# Patient Record
Sex: Male | Born: 1985 | Race: Black or African American | Hispanic: No | Marital: Single | Smoking: Current some day smoker
Health system: Southern US, Community
[De-identification: ages and names within clinical notes are randomized; demographics above are authoritative.]

## PROBLEM LIST (undated history)

## (undated) DIAGNOSIS — J45909 Unspecified asthma, uncomplicated: Secondary | ICD-10-CM

## (undated) HISTORY — PX: NO PAST SURGERIES: SHX2092

## (undated) HISTORY — PX: SHOULDER SURGERY: SHX246

## (undated) HISTORY — PX: WRIST SURGERY: SHX841

---

## 2011-07-04 ENCOUNTER — Emergency Department (INDEPENDENT_AMBULATORY_CARE_PROVIDER_SITE_OTHER): Payer: Self-pay

## 2011-07-04 ENCOUNTER — Encounter (HOSPITAL_BASED_OUTPATIENT_CLINIC_OR_DEPARTMENT_OTHER): Payer: Self-pay | Admitting: Emergency Medicine

## 2011-07-04 ENCOUNTER — Emergency Department (HOSPITAL_BASED_OUTPATIENT_CLINIC_OR_DEPARTMENT_OTHER)
Admission: EM | Admit: 2011-07-04 | Discharge: 2011-07-04 | Disposition: A | Discharge status: Home / Self Care | Payer: Self-pay | Attending: Emergency Medicine | Admitting: Emergency Medicine

## 2011-07-04 DIAGNOSIS — R350 Frequency of micturition: Secondary | ICD-10-CM | POA: Insufficient documentation

## 2011-07-04 DIAGNOSIS — R079 Chest pain, unspecified: Secondary | ICD-10-CM

## 2011-07-04 DIAGNOSIS — M549 Dorsalgia, unspecified: Secondary | ICD-10-CM | POA: Insufficient documentation

## 2011-07-04 DIAGNOSIS — R109 Unspecified abdominal pain: Secondary | ICD-10-CM

## 2011-07-04 DIAGNOSIS — D72829 Elevated white blood cell count, unspecified: Secondary | ICD-10-CM | POA: Insufficient documentation

## 2011-07-04 DIAGNOSIS — F17200 Nicotine dependence, unspecified, uncomplicated: Secondary | ICD-10-CM | POA: Insufficient documentation

## 2011-07-04 LAB — CBC
Hemoglobin: 13.5 g/dL (ref 13.0–17.0)
MCH: 29.7 pg (ref 26.0–34.0)
MCV: 78.5 fL (ref 78.0–100.0)
Platelets: 274 10*3/uL (ref 150–400)
RBC: 4.55 MIL/uL (ref 4.22–5.81)

## 2011-07-04 LAB — COMPREHENSIVE METABOLIC PANEL
CO2: 23 mEq/L (ref 19–32)
Calcium: 9.3 mg/dL (ref 8.4–10.5)
Creatinine, Ser: 1 mg/dL (ref 0.50–1.35)
GFR calc Af Amer: >90 mL/min (ref >90)
GFR calc non Af Amer: >90 mL/min (ref >90)
Glucose, Bld: 98 mg/dL (ref 70–99)

## 2011-07-04 LAB — URINALYSIS, ROUTINE W REFLEX MICROSCOPIC
Bilirubin Urine: NEGATIVE
Hgb urine dipstick: NEGATIVE
Ketones, ur: NEGATIVE mg/dL
Specific Gravity, Urine: 1.03 (ref 1.005–1.030)
Urobilinogen, UA: 1 mg/dL (ref 0.0–1.0)

## 2011-07-04 LAB — DIFFERENTIAL
Basophils Relative: 0 % (ref 0–1)
Eosinophils Relative: 1 % (ref 0–5)
Lymphs Abs: 2 10*3/uL (ref 0.7–4.0)
Monocytes Absolute: 1.6 10*3/uL — ABNORMAL HIGH (ref 0.1–1.0)

## 2011-07-04 LAB — LIPASE, BLOOD: Lipase: 42 U/L (ref 11–59)

## 2011-07-04 MED ORDER — MORPHINE SULFATE 4 MG/ML IJ SOLN
4.0000 mg | Freq: Once | INTRAMUSCULAR | Status: AC
  Administered 2011-07-04: 4 mg via INTRAVENOUS
  Filled 2011-07-04: qty 1

## 2011-07-04 MED ORDER — ONDANSETRON HCL 4 MG/2ML IJ SOLN
4.0000 mg | Freq: Once | INTRAMUSCULAR | Status: AC
  Administered 2011-07-04: 4 mg via INTRAVENOUS
  Filled 2011-07-04: qty 2

## 2011-07-04 MED ORDER — IOHEXOL 300 MG/ML  SOLN
100.0000 mL | Freq: Once | INTRAMUSCULAR | Status: AC | PRN
  Administered 2011-07-04: 100 mL via INTRAVENOUS

## 2011-07-04 MED ORDER — SODIUM CHLORIDE 0.9 % IV BOLUS (SEPSIS)
1000.0000 mL | Freq: Once | INTRAVENOUS | Status: AC
  Administered 2011-07-04: 1000 mL via INTRAVENOUS

## 2011-07-04 MED ORDER — IBUPROFEN 800 MG PO TABS: 800.0000 mg | ORAL_TABLET | Freq: Three times a day (TID) | ORAL | Status: AC

## 2011-07-04 MED ORDER — KETOROLAC TROMETHAMINE 30 MG/ML IJ SOLN
30.0000 mg | Freq: Once | INTRAMUSCULAR | Status: AC
  Administered 2011-07-04: 30 mg via INTRAVENOUS
  Filled 2011-07-04: qty 1

## 2011-07-04 MED ORDER — OXYCODONE-ACETAMINOPHEN 5-325 MG PO TABS: 2.0000 | ORAL_TABLET | ORAL | Status: AC | PRN

## 2011-07-04 NOTE — ED Notes (Signed)
Pt c/o body aches & RT side back pain (upper and lower); also c/o urinary urgency, nausea

## 2011-07-04 NOTE — ED Provider Notes (Addendum)
History     CSN: [id number]  Arrival date & time 07/04/11  1037   First MD Initiated Contact with Patient 07/04/11 1124      Chief Complaint  Patient presents with  . Back Pain  . Urinary Frequency   patient states he began having body aches, and right upper side and back pain. Last night. He did mention urinary frequency to the nurses, but states usually not sure if he has had urinary, problems. He's had some nausea, but no, vomiting. He's had some loose stool, minimal abdominal pain. No vomiting. No fevers. Low-grade temp noted in triage. Denies any significant cough. No headache, no dizziness or syncope. No neck pains. No recent illness  (Consider location/radiation/quality/duration/timing/severity/associated sxs/prior treatment) HPI  History reviewed. No pertinent past medical history.  History reviewed. No pertinent past surgical history.  No family history on file.  History  Substance Use Topics  . Smoking status: Current Everyday Smoker  . Smokeless tobacco: Not on file  . Alcohol Use: Yes     social      Review of Systems  All other systems reviewed and are negative.    Allergies  Review of patient's allergies indicates no known allergies.  Home Medications  No current outpatient prescriptions on file.  BP 133/69  Pulse 98  Temp(Src) 100.2 F (37.9 C) (Oral)  Resp 16  Ht 5\' 11"  (1.803 m)  Wt 165 lb (74.844 kg)  BMI 23.01 kg/m2  SpO2 99%  Physical Exam  Nursing note and vitals reviewed. Constitutional: He is oriented to person, place, and time. He appears well-developed and well-nourished.  HENT:  Head: Normocephalic and atraumatic.  Eyes: Conjunctivae and EOM are normal. Pupils are equal, round, and reactive to light.  Neck: Neck supple.  Cardiovascular: Normal rate and regular rhythm.  Exam reveals no gallop and no friction rub.   No murmur heard. Pulmonary/Chest: Breath sounds normal. He has no wheezes. He has no rales. He exhibits no  tenderness.       Diffuse tenderness to right posterior chest wall, no crepitance. No redness no swelling  Abdominal: Soft. Bowel sounds are normal. He exhibits no distension. There is no tenderness. There is no rebound and no guarding.  Musculoskeletal: Normal range of motion.  Neurological: He is alert and oriented to person, place, and time. No cranial nerve deficit. Coordination normal.  Skin: Skin is warm and dry. No rash noted.  Psychiatric: He has a normal mood and affect.    ED Course  Procedures (including critical care time)   Labs Reviewed  URINALYSIS, ROUTINE W REFLEX MICROSCOPIC   No results found.   No diagnosis found.    MDM  ,Pt is seen and examined;  Initial history and physical completed.  Will follow.       Results for orders placed during the hospital encounter of 07/04/11  URINALYSIS, ROUTINE W REFLEX MICROSCOPIC      Component Value Range   Color, Urine YELLOW  YELLOW    APPearance CLOUDY (*) CLEAR    Specific Gravity, Urine 1.030  1.005 - 1.030    pH 5.5  5.0 - 8.0    Glucose, UA 100 (*) NEGATIVE (mg/dL)   Hgb urine dipstick NEGATIVE  NEGATIVE    Bilirubin Urine NEGATIVE  NEGATIVE    Ketones, ur NEGATIVE  NEGATIVE (mg/dL)   Protein, ur NEGATIVE  NEGATIVE (mg/dL)   Urobilinogen, UA 1.0  0.0 - 1.0 (mg/dL)   Nitrite NEGATIVE  NEGATIVE    Leukocytes,  UA NEGATIVE  NEGATIVE   CBC      Component Value Range   WBC 19.5 (*) 4.0 - 10.5 (K/uL)   RBC 4.55  4.22 - 5.81 (MIL/uL)   Hemoglobin 13.5  13.0 - 17.0 (g/dL)   HCT 16.1 (*) 09.6 - 52.0 (%)   MCV 78.5  78.0 - 100.0 (fL)   MCH 29.7  26.0 - 34.0 (pg)   MCHC 37.8 (*) 30.0 - 36.0 (g/dL)   RDW 04.5  40.9 - 81.1 (%)   Platelets 274  150 - 400 (K/uL)  DIFFERENTIAL      Component Value Range   Neutrophils Relative 81 (*) 43 - 77 (%)   Lymphocytes Relative 10 (*) 12 - 46 (%)   Monocytes Relative 8  3 - 12 (%)   Eosinophils Relative 1  0 - 5 (%)   Basophils Relative 0  0 - 1 (%)   Neutro Abs 15.7  (*) 1.7 - 7.7 (K/uL)   Lymphs Abs 2.0  0.7 - 4.0 (K/uL)   Monocytes Absolute 1.6 (*) 0.1 - 1.0 (K/uL)   Eosinophils Absolute 0.2  0.0 - 0.7 (K/uL)   Basophils Absolute 0.0  0.0 - 0.1 (K/uL)   RBC Morphology TARGET CELLS    COMPREHENSIVE METABOLIC PANEL      Component Value Range   Sodium 138  135 - 145 (mEq/L)   Potassium 4.1  3.5 - 5.1 (mEq/L)   Chloride 105  96 - 112 (mEq/L)   CO2 23  19 - 32 (mEq/L)   Glucose, Bld 98  70 - 99 (mg/dL)   BUN 12  6 - 23 (mg/dL)   Creatinine, Ser 9.14  0.50 - 1.35 (mg/dL)   Calcium 9.3  8.4 - 78.2 (mg/dL)   Total Protein 6.2  6.0 - 8.3 (g/dL)   Albumin 3.5  3.5 - 5.2 (g/dL)   AST 11  0 - 37 (U/L)   ALT 12  0 - 53 (U/L)   Alkaline Phosphatase 63  39 - 117 (U/L)   Total Bilirubin 0.4  0.3 - 1.2 (mg/dL)   GFR calc non Af Amer >90  >90 (mL/min)   GFR calc Af Amer >90  >90 (mL/min)  LIPASE, BLOOD      Component Value Range   Lipase 42  11 - 59 (U/L)   Dg Chest 2 View  07/04/2011  *RADIOLOGY REPORT*  Clinical Data: Upper back, chest and side pain.  CHEST - 2 VIEW  Comparison: None.  Findings: Trachea is midline.  Heart size normal.  Lungs are clear. No pleural fluid.  IMPRESSION: No acute findings.  Original Report Authenticated By: Reyes Ivan, M.D.   Ct Chest W Contrast  07/04/2011  *RADIOLOGY REPORT*  Clinical Data: Right-sided chest tube, abdominal and flank pain  CT CHEST WITH CONTRAST  Technique:  Multidetector CT imaging of the chest was performed following the standard protocol during bolus administration of intravenous contrast.  Contrast:  100 ml Omnipaque-300  Comparison: None.  Findings: The lungs are clear.  No evidence of infiltrate, mass, effusion or collapse.  No pleural or pericardial fluid.  No hilar or mediastinal mass or adenopathy.  There is very mild spinal curvature.  No evidence of fracture or degenerative change.  No rib or chest wall lesion is evident.  Major vessels appear unremarkable.  IMPRESSION: Negative CT scan of the  chest.  No cause of right-sided pain is identified.  Original Report Authenticated By: Thomasenia Sales, M.D.   Ct  Abdomen Pelvis W Contrast  07/04/2011  *RADIOLOGY REPORT*  Clinical Data: Right-sided chest back and flank pain.  CT ABDOMEN AND PELVIS WITH CONTRAST  Technique:  Multidetector CT imaging of the abdomen and pelvis was performed following the standard protocol during bolus administration of intravenous contrast.  Contrast: OMNIPAQUE IOHEXOL 300 MG/ML IV SOLN  Comparison: None.  Findings: The liver has a normal appearance without focal lesions or biliary ductal dilatation.  No calcified gallstones.  The spleen is normal.  The pancreas is normal.  The adrenal glands are normal. The kidneys are normal.  The aorta and IVC are normal.  No retroperitoneal mass or adenopathy.  No free intraperitoneal fluid or air.  No bowel pathology is seen.  Bladder, prostate gland and seminal vesicles are unremarkable.  The appendix is normal.  There is mild curvature of the spine but no acute bony finding.  IMPRESSION: Negative CT scan of the abdomen and pelvis.  No cause of right- sided pain is identified.  Original Report Authenticated By: Thomasenia Sales, M.D.    2:02 PM Patient reexamined. Still having some right middle musculoskeletal back pain and tenderness. He did call the radiologist didn't have the CT scan over read over the did not see anything additional. No specific cause for his back pain, and white count may be viral. There is no convincing evidence of urinary infection. Nothing at this time that would require admission. Will give additional pain medications and likely discharged home to have the patient. Followup tomorrow to be rechecked   3:01 PM  Patient reexamined and is feeling much better. No specific etiology for his back pain and leukocytosis. Patient will however be stable for discharge. He'll be instructed to return to the ED tomorrow to have his back rechecked and to be reassessed.  Overall, he is to return for any concerns such as fever or difficulty breathing, syncope, etc.  Theron Arista A. Patrica Duel, MD 07/04/11 1502   3:06 PM  Pt stable for dc home.  Will follow up in am for re-check.  No specific etiology found for back pain.  Stable.  Jawaun Celmer A. Patrica Duel, MD 07/04/11 1506    Cedarius Kersh A. Patrica Duel, MD 07/07/11 1478  Lorelle Gibbs. Patrica Duel, MD 07/07/11 [phone]

## 2011-07-04 NOTE — ED Notes (Signed)
Patient state he was beginning to get body aches last night so he drank a lot of liqueur and beer trying to flush out the illness.  Woke up this morning with nausea and low back pain which is associated with urinary urgency.

## 2011-07-04 NOTE — Discharge Instructions (Signed)

## 2011-07-05 ENCOUNTER — Encounter (HOSPITAL_BASED_OUTPATIENT_CLINIC_OR_DEPARTMENT_OTHER): Payer: Self-pay

## 2011-07-05 ENCOUNTER — Emergency Department (HOSPITAL_BASED_OUTPATIENT_CLINIC_OR_DEPARTMENT_OTHER)
Admission: EM | Admit: 2011-07-05 | Discharge: 2011-07-05 | Disposition: A | Discharge status: Home / Self Care | Payer: Self-pay | Attending: Emergency Medicine | Admitting: Emergency Medicine

## 2011-07-05 DIAGNOSIS — F17200 Nicotine dependence, unspecified, uncomplicated: Secondary | ICD-10-CM | POA: Insufficient documentation

## 2011-07-05 DIAGNOSIS — M549 Dorsalgia, unspecified: Secondary | ICD-10-CM | POA: Insufficient documentation

## 2011-07-05 DIAGNOSIS — R079 Chest pain, unspecified: Secondary | ICD-10-CM | POA: Insufficient documentation

## 2011-07-05 LAB — DIFFERENTIAL
Basophils Relative: 0 % (ref 0–1)
Eosinophils Absolute: 0.3 10*3/uL (ref 0.0–0.7)
Lymphocytes Relative: 21 % (ref 12–46)
Neutro Abs: 9.3 10*3/uL — ABNORMAL HIGH (ref 1.7–7.7)

## 2011-07-05 LAB — CBC
HCT: 33.1 % — ABNORMAL LOW (ref 39.0–52.0)
Hemoglobin: 12.2 g/dL — ABNORMAL LOW (ref 13.0–17.0)
MCH: 29.5 pg (ref 26.0–34.0)
MCHC: 36.9 g/dL — ABNORMAL HIGH (ref 30.0–36.0)

## 2011-07-05 LAB — URINE CULTURE

## 2011-07-05 NOTE — Discharge Instructions (Signed)
Back Pain, Adult Low back pain is very common. About 1 in 5 people have back pain.The cause of low back pain is rarely dangerous. The pain often gets better over time.About half of people with a sudden onset of back pain feel better in just 2 weeks. About 8 in 10 people feel better by 6 weeks.  CAUSES Some common causes of back pain include:  Strain of the muscles or ligaments supporting the spine.   Wear and tear (degeneration) of the spinal discs.   Arthritis.   Direct injury to the back.  DIAGNOSIS Most of the time, the direct cause of low back pain is not known.However, back pain can be treated effectively even when the exact cause of the pain is unknown.Answering your caregiver's questions about your overall health and symptoms is one of the most accurate ways to make sure the cause of your pain is not dangerous. If your caregiver needs more information, he or she may order lab work or imaging tests (X-rays or MRIs).However, even if imaging tests show changes in your back, this usually does not require surgery. HOME CARE INSTRUCTIONS For many people, back pain returns.Since low back pain is rarely dangerous, it is often a condition that people can learn to manageon their own.   Remain active. It is stressful on the back to sit or stand in one place. Do not sit, drive, or stand in one place for more than 30 minutes at a time. Take [place] walks on level surfaces as soon as pain allows.Try to increase the length of time you walk each day.   Do not stay in bed.Resting more than 1 or 2 days can delay your recovery.   Do not avoid exercise or work.Your body is made to move.It is not dangerous to be active, even though your back may hurt.Your back will likely heal faster if you return to being active before your pain is gone.   Pay attention to your body when you bend and lift. Many people have less discomfortwhen lifting if they bend their knees, keep the load close to their  bodies,and avoid twisting. Often, the most comfortable positions are those that put less stress on your recovering back.   Find a comfortable position to sleep. Use a firm mattress and lie on your side with your knees slightly bent. If you lie on your back, put a pillow under your knees.   Only take over-the-counter or prescription medicines as directed by your caregiver. Over-the-counter medicines to reduce pain and inflammation are often the most helpful.Your caregiver may prescribe muscle relaxant drugs.These medicines help dull your pain so you can more quickly return to your normal activities and healthy exercise.   Put ice on the injured area.   Put ice in a plastic bag.   Place a towel between your skin and the bag.   Leave the ice on for 15 to 20 minutes, 3 to 4 times a day for the first 2 to 3 days. After that, ice and heat may be alternated to reduce pain and spasms.   Ask your caregiver about trying back exercises and gentle massage. This may be of some benefit.   Avoid feeling anxious or stressed.Stress increases muscle tension and can worsen back pain.It is important to recognize when you are anxious or stressed and learn ways to manage it.Exercise is a great option.  SEEK MEDICAL CARE IF:  You have pain that is not relieved with rest or medicine.   You have   pain that does not improve in 1 week.   You have new symptoms.   You are generally not feeling well.  SEEK IMMEDIATE MEDICAL CARE IF:   You have pain that radiates from your back into your legs.   You develop new bowel or bladder control problems.   You have unusual weakness or numbness in your arms or legs.   You develop nausea or vomiting.   You develop abdominal pain.   You feel faint.  Document Released: 05/07/2005 Document Revised: 01/17/2011 Document Reviewed: 09/25/2010 [employer] Patient Information 2012 [employer]. 

## 2011-07-05 NOTE — ED Provider Notes (Signed)
History     CSN: [id number]  Arrival date & time 07/05/11  1148   First MD Initiated Contact with Patient 07/05/11 1208      Chief Complaint  Patient presents with  . Follow-up  . Back Pain    (Consider location/radiation/quality/duration/timing/severity/associated sxs/prior treatment) Patient is a 26 y.o. male presenting with back pain. The history is provided by the patient. No language interpreter was used.  Back Pain  This is a new problem. The current episode started 2 days ago. The problem has been rapidly improving. The pain is associated with no known injury. The pain is present in the thoracic spine. The quality of the pain is described as stabbing. The pain is moderate. Pertinent negatives include no chest pain and no fever. He has tried analgesics for the symptoms.  Pt reports he is feeling much better today  History reviewed. No pertinent past medical history.  History reviewed. No pertinent past surgical history.  No family history on file.  History  Substance Use Topics  . Smoking status: Current Everyday Smoker  . Smokeless tobacco: Not on file  . Alcohol Use: Yes     social      Review of Systems  Constitutional: Negative for fever.  Cardiovascular: Negative for chest pain.  Musculoskeletal: Positive for back pain.  All other systems reviewed and are negative.    Allergies  Review of patient's allergies indicates no known allergies.  Home Medications   Current Outpatient Rx  Name Route Sig Dispense Refill  . IBUPROFEN 800 MG PO TABS Oral Take 1 tablet (800 mg total) by mouth 3 (three) times daily. 21 tablet 0  . OXYCODONE-ACETAMINOPHEN 5-325 MG PO TABS Oral Take 2 tablets by mouth every 4 (four) hours as needed for pain. 15 tablet 0    BP 127/92  Pulse 60  Temp(Src) 98 F (36.7 C) (Oral)  Resp 16  Ht 5\' 11"  (1.803 m)  Wt 164 lb (74.39 kg)  BMI 22.87 kg/m2  SpO2 97%  Physical Exam  Nursing note and vitals reviewed. Constitutional: He  is oriented to person, place, and time. He appears well-developed and well-nourished.  HENT:  Head: Normocephalic and atraumatic.  Eyes: Conjunctivae are normal.  Cardiovascular: Normal rate.   Pulmonary/Chest: Effort normal.  Musculoskeletal: Normal range of motion.  Neurological: He is alert and oriented to person, place, and time. He has normal reflexes.  Skin: Skin is warm.  Psychiatric: He has a normal mood and affect.   Decreased swelling of back,  Labs show decreased wbc count.   Pt counseled  I advised pt to return if any problems. ED Course  Procedures (including critical care time)   Labs Reviewed  CBC  DIFFERENTIAL   Dg Chest 2 View  07/04/2011  *RADIOLOGY REPORT*  Clinical Data: Upper back, chest and side pain.  CHEST - 2 VIEW  Comparison: None.  Findings: Trachea is midline.  Heart size normal.  Lungs are clear. No pleural fluid.  IMPRESSION: No acute findings.  Original Report Authenticated By: Reyes Ivan, M.D.   Ct Chest W Contrast  07/04/2011  *RADIOLOGY REPORT*  Clinical Data: Right-sided chest tube, abdominal and flank pain  CT CHEST WITH CONTRAST  Technique:  Multidetector CT imaging of the chest was performed following the standard protocol during bolus administration of intravenous contrast.  Contrast:  100 ml Omnipaque-300  Comparison: None.  Findings: The lungs are clear.  No evidence of infiltrate, mass, effusion or collapse.  No pleural or pericardial  fluid.  No hilar or mediastinal mass or adenopathy.  There is very mild spinal curvature.  No evidence of fracture or degenerative change.  No rib or chest wall lesion is evident.  Major vessels appear unremarkable.  IMPRESSION: Negative CT scan of the chest.  No cause of right-sided pain is identified.  Original Report Authenticated By: Thomasenia Sales, M.D.   Ct Abdomen Pelvis W Contrast  07/04/2011  *RADIOLOGY REPORT*  Clinical Data: Right-sided chest back and flank pain.  CT ABDOMEN AND PELVIS WITH CONTRAST   Technique:  Multidetector CT imaging of the abdomen and pelvis was performed following the standard protocol during bolus administration of intravenous contrast.  Contrast: OMNIPAQUE IOHEXOL 300 MG/ML IV SOLN  Comparison: None.  Findings: The liver has a normal appearance without focal lesions or biliary ductal dilatation.  No calcified gallstones.  The spleen is normal.  The pancreas is normal.  The adrenal glands are normal. The kidneys are normal.  The aorta and IVC are normal.  No retroperitoneal mass or adenopathy.  No free intraperitoneal fluid or air.  No bowel pathology is seen.  Bladder, prostate gland and seminal vesicles are unremarkable.  The appendix is normal.  There is mild curvature of the spine but no acute bony finding.  IMPRESSION: Negative CT scan of the abdomen and pelvis.  No cause of right- sided pain is identified.  Original Report Authenticated By: Thomasenia Sales, M.D.     No diagnosis found.    MDM         Langston Masker, PA 07/05/11 1416

## 2011-07-05 NOTE — ED Notes (Signed)
Pt reports he was seen in ED yesterday for back pain and elevated WBC.  He is here for a follow up.  States improvement in back pain.

## 2011-07-05 NOTE — ED Provider Notes (Signed)
Medical screening examination/treatment/procedure(s) were performed by non-physician practitioner and as supervising physician I was immediately available for consultation/collaboration.  Raeford Razor, MD 07/05/11 1556

## 2013-03-09 IMAGING — CR DG CHEST 2V
2 series · 2 of 2 positions shown · non-contrast
Comparison: None.

CLINICAL DATA: Upper back, chest and side pain.

CHEST - 2 VIEW

[w chest pa]
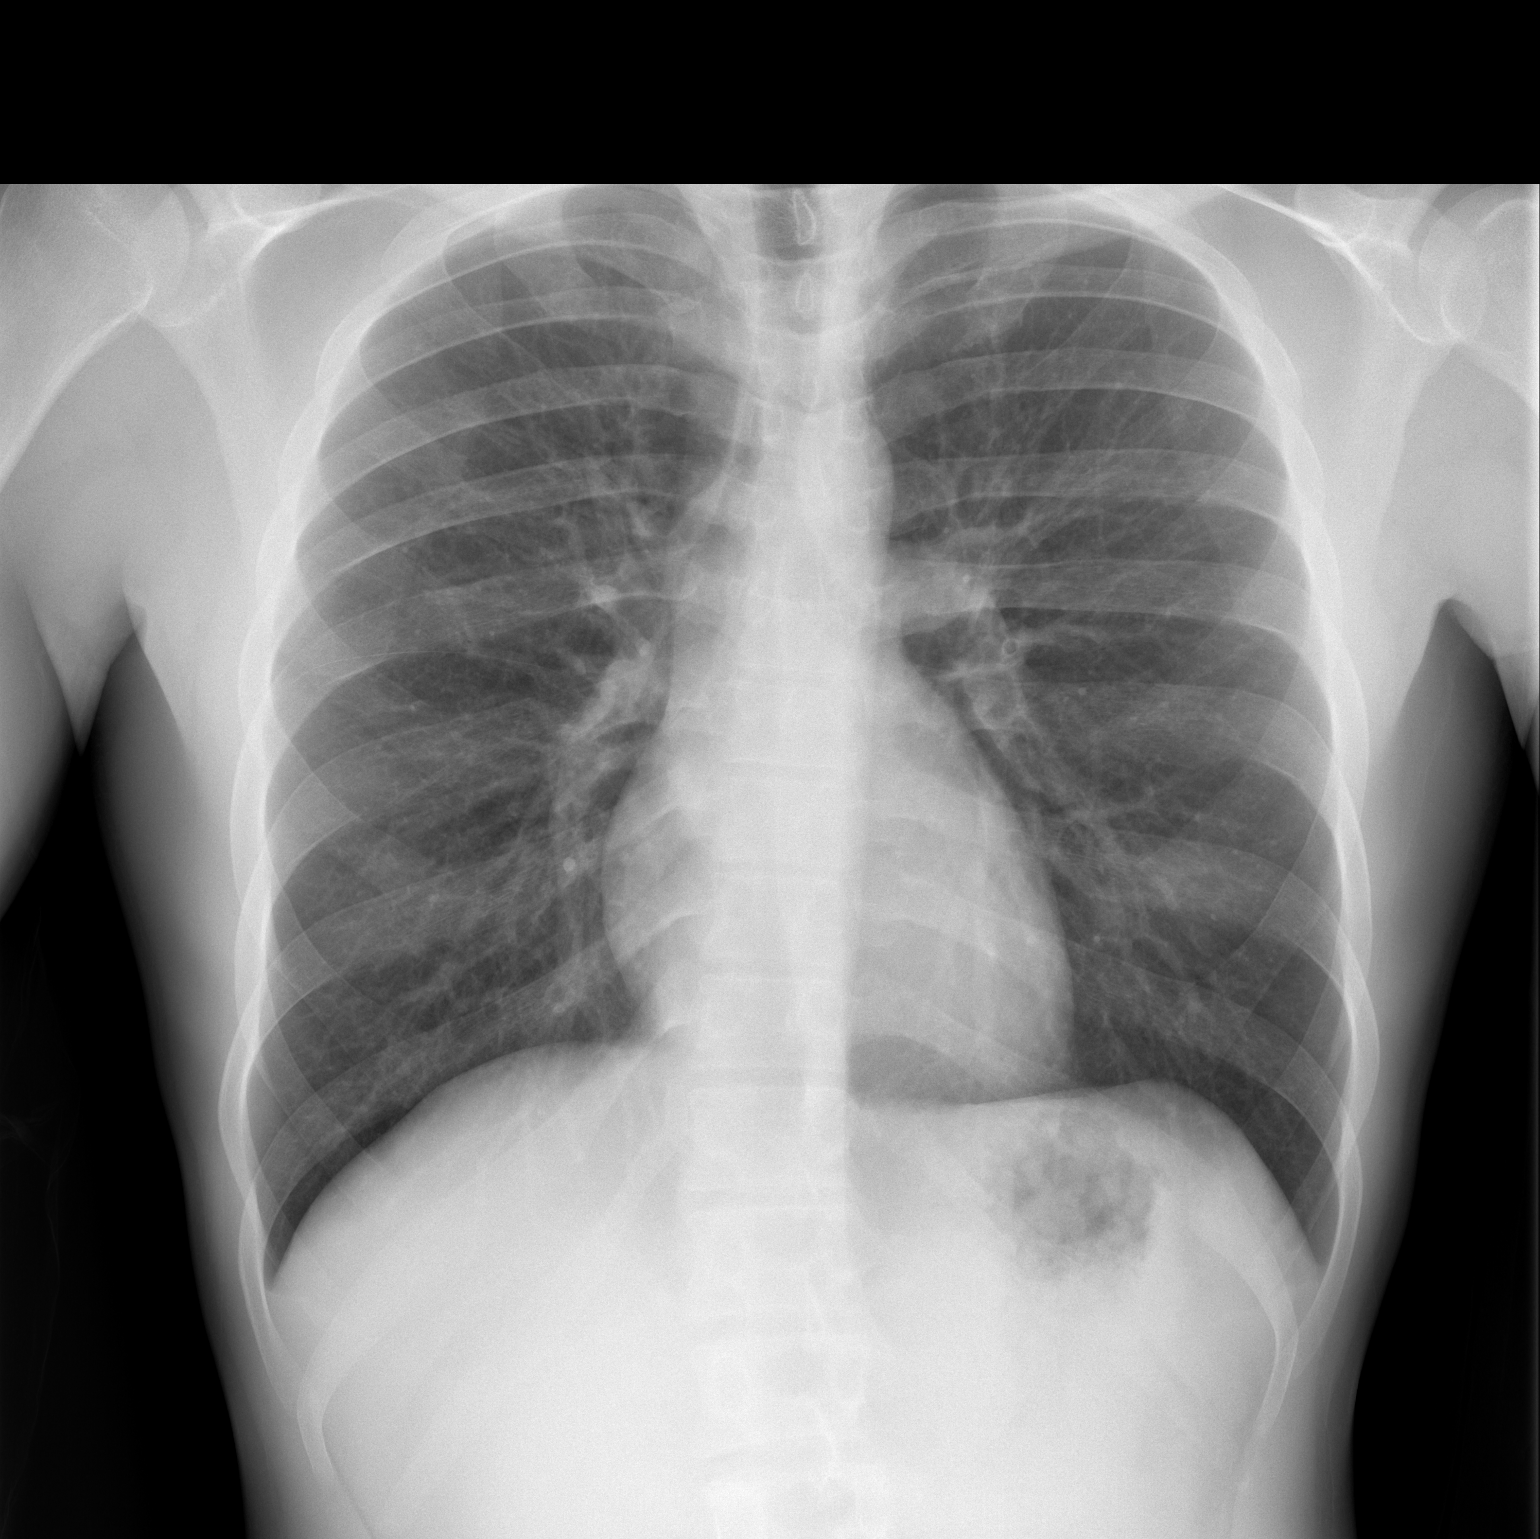

[w chest lat]
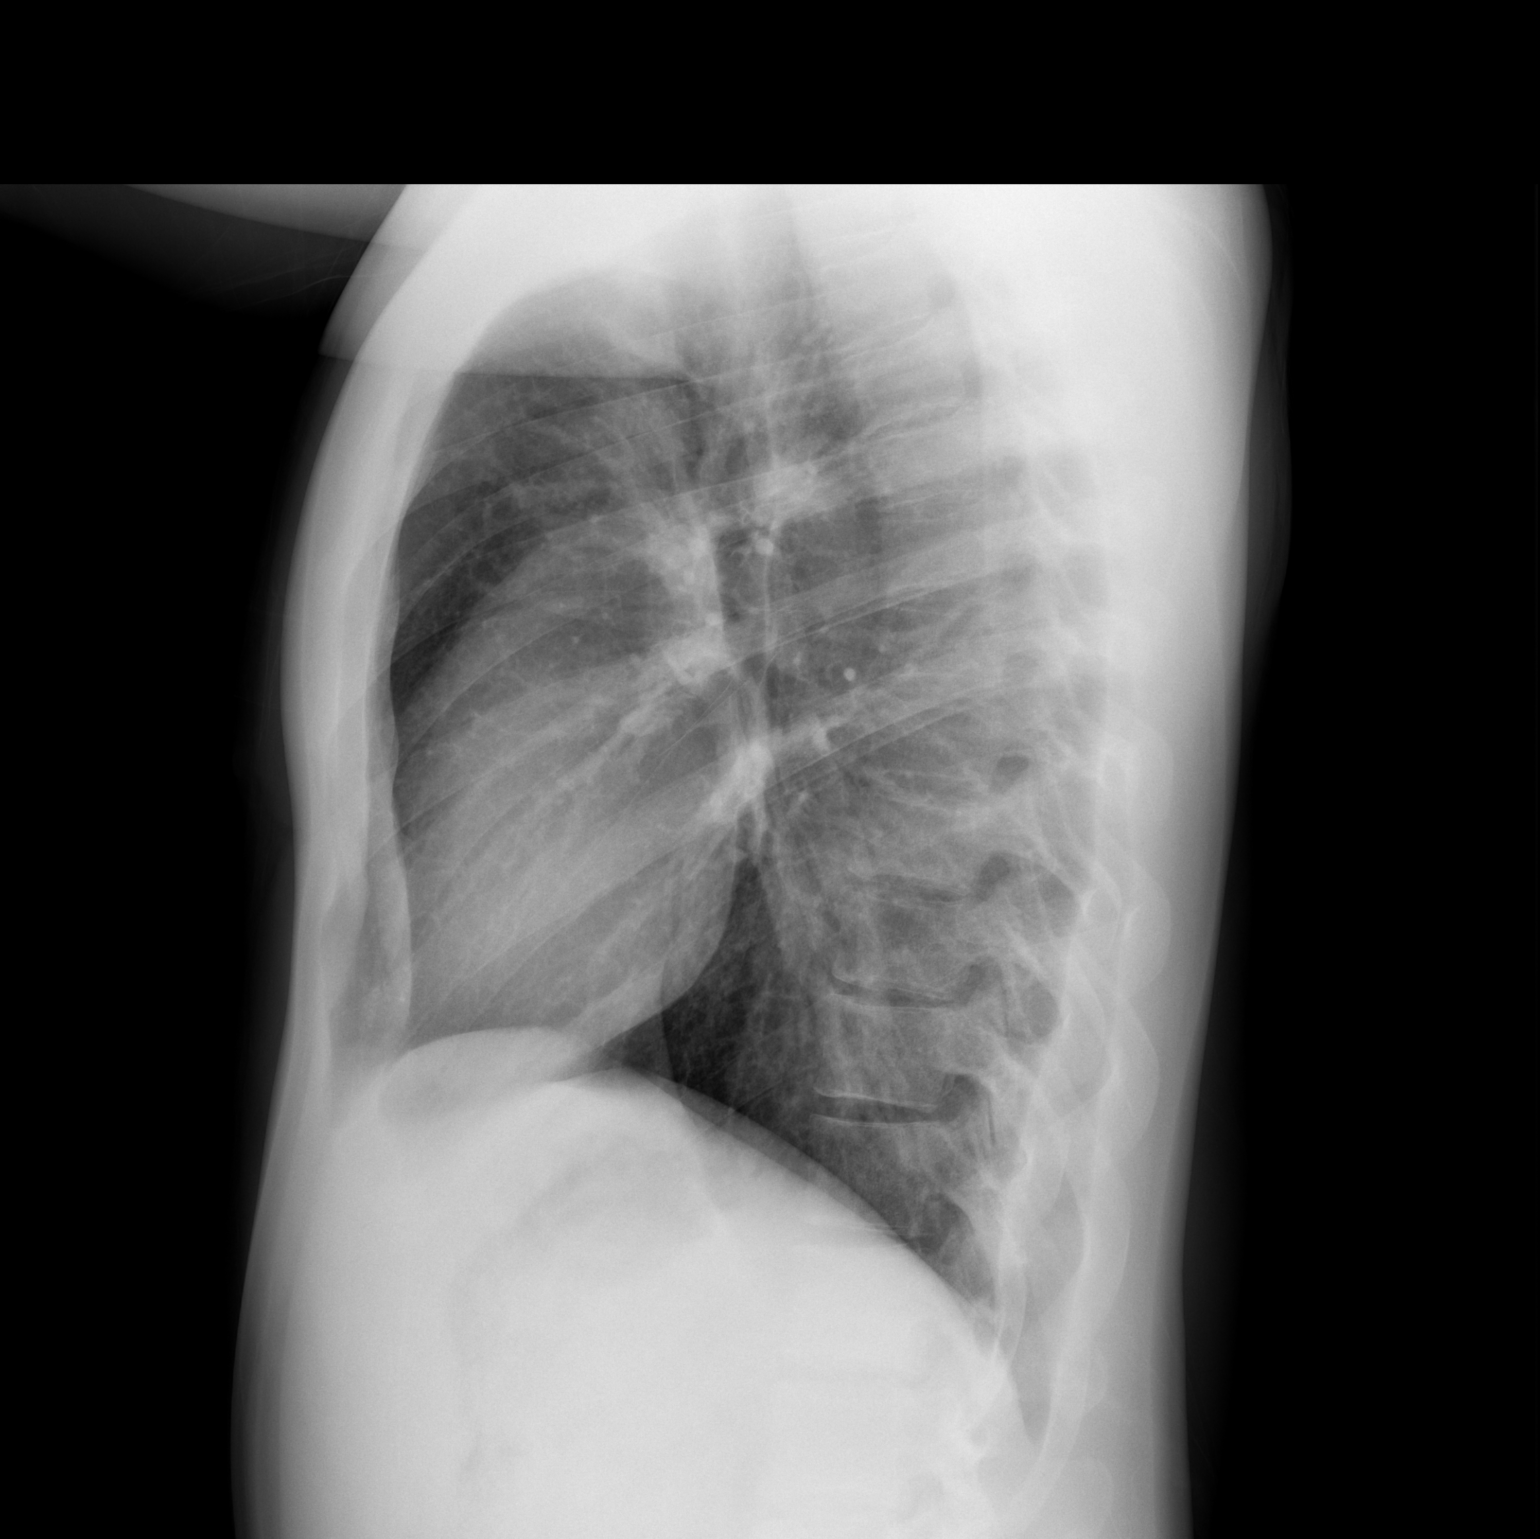

[2 of 2 positions shown; findings below may reference images not displayed]

FINDINGS: Trachea is midline.  Heart size normal.  Lungs are clear.
No pleural fluid.
IMPRESSION: No acute findings.

## 2017-02-20 ENCOUNTER — Encounter (HOSPITAL_BASED_OUTPATIENT_CLINIC_OR_DEPARTMENT_OTHER): Payer: Self-pay | Admitting: Emergency Medicine

## 2017-02-20 ENCOUNTER — Emergency Department (HOSPITAL_BASED_OUTPATIENT_CLINIC_OR_DEPARTMENT_OTHER)
Admission: EM | Admit: 2017-02-20 | Discharge: 2017-02-20 | Disposition: A | Discharge status: Home / Self Care | Payer: Self-pay | Attending: Emergency Medicine | Admitting: Emergency Medicine

## 2017-02-20 DIAGNOSIS — F17200 Nicotine dependence, unspecified, uncomplicated: Secondary | ICD-10-CM | POA: Insufficient documentation

## 2017-02-20 DIAGNOSIS — R05 Cough: Secondary | ICD-10-CM | POA: Insufficient documentation

## 2017-02-20 DIAGNOSIS — J069 Acute upper respiratory infection, unspecified: Secondary | ICD-10-CM | POA: Insufficient documentation

## 2017-02-20 DIAGNOSIS — B9789 Other viral agents as the cause of diseases classified elsewhere: Secondary | ICD-10-CM

## 2017-02-20 DIAGNOSIS — J45909 Unspecified asthma, uncomplicated: Secondary | ICD-10-CM | POA: Insufficient documentation

## 2017-02-20 HISTORY — DX: Unspecified asthma, uncomplicated: J45.909

## 2017-02-20 LAB — RAPID STREP SCREEN (MED CTR MEBANE ONLY): STREPTOCOCCUS, GROUP A SCREEN (DIRECT): NEGATIVE

## 2017-02-20 MED ORDER — BENZONATATE 100 MG PO CAPS
100.0000 mg | ORAL_CAPSULE | Freq: Once | ORAL | Status: AC
  Administered 2017-02-20: 100 mg via ORAL
  Filled 2017-02-20: qty 1

## 2017-02-20 MED ORDER — NAPROXEN 500 MG PO TABS: 500.0000 mg | ORAL_TABLET | Freq: Two times a day (BID) | ORAL | 0 refills | Status: DC

## 2017-02-20 MED ORDER — BENZONATATE 100 MG PO CAPS: 100.0000 mg | ORAL_CAPSULE | Freq: Three times a day (TID) | ORAL | 0 refills | Status: DC

## 2017-02-20 MED ORDER — NAPROXEN 250 MG PO TABS
500.0000 mg | ORAL_TABLET | Freq: Once | ORAL | Status: AC
  Administered 2017-02-20: 500 mg via ORAL
  Filled 2017-02-20: qty 2

## 2017-02-20 NOTE — Discharge Instructions (Signed)
Please read and follow all provided instructions.  Your diagnoses today include:  1. Viral URI with cough    You appear to have an upper respiratory infection (URI). An upper respiratory tract infection, or cold, is a viral infection of the air passages leading to the lungs. It should improve gradually after 5-7 days. You may have a lingering cough that lasts for 2- 4 weeks after the infection.  Tests performed today include:  Vital signs. See below for your results today.   Strep test - negative for strep throat  Medications prescribed:   Naproxen - anti-inflammatory pain medication  Do not exceed  naproxen every 12 hours, take with food  You have been prescribed an anti-inflammatory medication or NSAID. Take with food. Take smallest effective dose for the shortest duration needed for your pain. Stop taking if you experience stomach pain or vomiting.    Tessalon Perles - cough suppressant medication  Take any prescribed medications only as directed. Treatment for your infection is aimed at treating the symptoms. There are no medications, such as antibiotics, that will cure your infection.   Home care instructions:  Follow any educational materials contained in this packet.   Your illness is contagious and can be spread to others, especially during the first 3 or 4 days. It cannot be cured by antibiotics or other medicines. Take basic precautions such as washing your hands often, covering your mouth when you cough or sneeze, and avoiding public places where you could spread your illness to others.   Please continue drinking plenty of fluids.  Use over-the-counter medicines as needed as directed on packaging for symptom relief.  You may also use ibuprofen or tylenol as directed on packaging for pain or fever.  Do not take multiple medicines containing Tylenol or acetaminophen to avoid taking too much of this medication.  Follow-up instructions: Please follow-up with your primary  care provider in the next 3 days for further evaluation of your symptoms if you are not feeling better.   Return instructions:   Please return to the Emergency Department if you experience worsening symptoms.   RETURN IMMEDIATELY IF you develop shortness of breath, confusion or altered mental status, a new rash, become dizzy, faint, or poorly responsive, or are unable to be cared for at home.  Please return if you have persistent vomiting and cannot keep down fluids or develop a fever that is not controlled by tylenol or motrin.    Please return if you have any other emergent concerns.  Additional Information:  Your vital signs today were: BP 135/84 (BP Location: Right Arm)    Pulse 100    Temp 99.5 F (37.5 C) (Oral)    Resp 16    Ht  (1.803 m)    Wt 77.1 kg (170 lb)    SpO2 97%    BMI 23.71 kg/m  If your blood pressure (BP) was elevated above 135/85 this visit, please have this repeated by your doctor within one month. --------------

## 2017-02-20 NOTE — ED Triage Notes (Signed)
Dry cough, sore throat, muscle and joint aches, runny nose x2 days.  Denies n/v/d.

## 2017-02-20 NOTE — ED Provider Notes (Signed)
[facility]   CSN: [id number] Arrival date & time: 02/20/17  2009     History   Chief Complaint Chief Complaint  Patient presents with  . Cough    HPI Lance Ball is a 31 y.o. male.  Patient presents with complaint of viral type symptoms including nasal congestion/rhinorrhea, sore throat, muscle aches, nonproductive cough ongoing over the past 2 days. Patient states that his stepdaughter is sick at home with similar symptoms. No nausea, vomiting, or diarrhea. No chest pain or abdominal pain. Patient has been able to eat and drink. He took Claritin without relief at home. Does have a history of significant seasonal allergies. The onset of this condition was acute. The course is constant. Aggravating factors: none. Alleviating factors: none.        Past Medical History:  Diagnosis Date  . Asthma     There are no active problems to display for this patient.   History reviewed. No pertinent surgical history.     Home Medications    Prior to Admission medications   Not on File    Family History No family history on file.  Social History Social History  Substance Use Topics  . Smoking status: Current Some Day Smoker  . Smokeless tobacco: Never Used     Comment: 2-3 cigarettes/week  . Alcohol use Yes     Comment: social     Allergies   Patient has no known allergies.   Review of Systems Review of Systems  Constitutional: Positive for fatigue. Negative for chills and fever.  HENT: Positive for congestion, rhinorrhea and sore throat. Negative for ear pain and sinus pressure.   Eyes: Negative for redness.  Respiratory: Positive for cough. Negative for shortness of breath and wheezing.   Gastrointestinal: Negative for abdominal pain, diarrhea, nausea and vomiting.  Genitourinary: Negative for dysuria.  Musculoskeletal: Positive for myalgias. Negative for neck stiffness.  Skin: Negative for rash.  Neurological: Negative for  headaches.  Hematological: Negative for adenopathy.     Physical Exam Updated Vital Signs BP 135/84 (BP Location: Right Arm)   Pulse 100   Temp 99.5 F (37.5 C) (Oral)   Resp 16   Ht  (1.803 m)   Wt 77.1 kg (170 lb)   SpO2 97%   BMI 23.71 kg/m   Physical Exam  Constitutional: He appears well-developed and well-nourished.  HENT:  Head: Normocephalic and atraumatic.  Right Ear: Tympanic membrane, external ear and ear canal normal.  Left Ear: Tympanic membrane, external ear and ear canal normal.  Nose: Mucosal edema (L>R) present. No rhinorrhea.  Mouth/Throat: Uvula is midline and mucous membranes are normal. Mucous membranes are not dry. No trismus in the jaw. No uvula swelling. Posterior oropharyngeal erythema present. No oropharyngeal exudate, posterior oropharyngeal edema or tonsillar abscesses.  Eyes: Conjunctivae are normal. Right eye exhibits no discharge. Left eye exhibits no discharge.  Neck: Normal range of motion. Neck supple.  Cardiovascular: Normal rate, regular rhythm and normal heart sounds.   Pulmonary/Chest: Effort normal and breath sounds normal. No respiratory distress. He has no wheezes. He has no rales.  Abdominal: Soft. There is no tenderness.  Lymphadenopathy:    He has no cervical adenopathy.  Neurological: He is alert.  Skin: Skin is warm and dry.  Psychiatric: He has a normal mood and affect.  Nursing note and vitals reviewed.    ED Treatments / Results  Labs (all labs ordered are listed, but only abnormal results are displayed) Labs  Reviewed  RAPID STREP SCREEN (NOT AT [facility])  CULTURE, GROUP A STREP [facility])    Procedures Procedures (including critical care time)  Medications Ordered in ED Medications - No data to display   Initial Impression / Assessment and Plan / ED Course  I have reviewed the triage vital signs and the nursing notes.  Pertinent labs & imaging results that were available during my care of the patient were  reviewed by me and considered in my medical decision making (see chart for details).     Patient seen and examined. Strep test ordered. If negative, will treat symptomatically. Patient appears well.  He will need no further work. Vital signs reviewed and are as follows: BP 135/84 (BP Location: Right Arm)   Pulse 100   Temp 99.5 F (37.5 C) (Oral)   Resp 16   Ht  (1.803 m)   Wt 77.1 kg (170 lb)   SpO2 97%   BMI 23.71 kg/m   9:18 PM Strep neg. Patient counseled on supportive care for viral URI and s/s to return including worsening symptoms, persistent fever, persistent vomiting, or if they have any other concerns. Urged to see PCP if symptoms persist for more than 3 days. Patient verbalizes understanding and agrees with plan.    Final Clinical Impressions(s) / ED Diagnoses   Final diagnoses:  Viral URI with cough   Patient with symptoms consistent with a viral syndrome. Strep neg. Vitals are stable, no fever. No signs of dehydration. Lung exam normal, no signs of pneumonia. Supportive therapy indicated with return if symptoms worsen.     New Prescriptions New Prescriptions   BENZONATATE (TESSALON) 100 MG CAPSULE    Take 1 capsule (100 mg total) by mouth every 8 (eight) hours.   NAPROXEN (NAPROSYN) 500 MG TABLET    Take 1 tablet (500 mg total) by mouth 2 (two) times daily.     Renne Crigler, PA-C 02/20/17 2118    Maia Plan, MD 02/21/17 [phone]

## 2017-02-23 LAB — CULTURE, GROUP A STREP (THRC)

## 2017-05-28 ENCOUNTER — Emergency Department (HOSPITAL_BASED_OUTPATIENT_CLINIC_OR_DEPARTMENT_OTHER)
Admission: EM | Admit: 2017-05-28 | Discharge: 2017-05-28 | Disposition: A | Discharge status: Home / Self Care | Payer: Self-pay | Attending: Emergency Medicine | Admitting: Emergency Medicine

## 2017-05-28 ENCOUNTER — Other Ambulatory Visit: Payer: Self-pay

## 2017-05-28 ENCOUNTER — Encounter (HOSPITAL_BASED_OUTPATIENT_CLINIC_OR_DEPARTMENT_OTHER): Payer: Self-pay

## 2017-05-28 DIAGNOSIS — R600 Localized edema: Secondary | ICD-10-CM | POA: Insufficient documentation

## 2017-05-28 DIAGNOSIS — J4520 Mild intermittent asthma, uncomplicated: Secondary | ICD-10-CM | POA: Insufficient documentation

## 2017-05-28 DIAGNOSIS — F17210 Nicotine dependence, cigarettes, uncomplicated: Secondary | ICD-10-CM | POA: Insufficient documentation

## 2017-05-28 LAB — COMPREHENSIVE METABOLIC PANEL
ALT: 11 U/L — ABNORMAL LOW (ref 17–63)
ANION GAP: 7 (ref 5–15)
AST: 20 U/L (ref 15–41)
Albumin: 3.2 g/dL — ABNORMAL LOW (ref 3.5–5.0)
Alkaline Phosphatase: 43 U/L (ref 38–126)
BUN: 20 mg/dL (ref 6–20)
CHLORIDE: 110 mmol/L (ref 101–111)
CO2: 24 mmol/L (ref 22–32)
Calcium: 8.4 mg/dL — ABNORMAL LOW (ref 8.9–10.3)
Creatinine, Ser: 1.24 mg/dL (ref 0.61–1.24)
GFR calc non Af Amer: >60 mL/min (ref >60)
Glucose, Bld: 104 mg/dL — ABNORMAL HIGH (ref 65–99)
Potassium: 3.8 mmol/L (ref 3.5–5.1)
SODIUM: 141 mmol/L (ref 135–145)
Total Bilirubin: 0.5 mg/dL (ref 0.3–1.2)
Total Protein: 5.2 g/dL — ABNORMAL LOW (ref 6.5–8.1)

## 2017-05-28 LAB — CBC WITH DIFFERENTIAL/PLATELET
Basophils Absolute: 0.1 10*3/uL (ref 0.0–0.1)
Basophils Relative: 1 %
Eosinophils Absolute: 0.3 10*3/uL (ref 0.0–0.7)
Eosinophils Relative: 3 %
HEMATOCRIT: 33.2 % — AB (ref 39.0–52.0)
HEMOGLOBIN: 12.1 g/dL — AB (ref 13.0–17.0)
LYMPHS ABS: 5 10*3/uL — AB (ref 0.7–4.0)
Lymphocytes Relative: 50 %
MCH: 29.7 pg (ref 26.0–34.0)
MCHC: 36.4 g/dL — AB (ref 30.0–36.0)
MCV: 81.4 fL (ref 78.0–100.0)
MONOS PCT: 8 %
Monocytes Absolute: 0.8 10*3/uL (ref 0.1–1.0)
NEUTROS ABS: 3.7 10*3/uL (ref 1.7–7.7)
NEUTROS PCT: 38 %
Platelets: 304 10*3/uL (ref 150–400)
RBC: 4.08 MIL/uL — ABNORMAL LOW (ref 4.22–5.81)
RDW: 13.3 % (ref 11.5–15.5)
WBC: 9.9 10*3/uL (ref 4.0–10.5)

## 2017-05-28 MED ORDER — ALBUTEROL SULFATE HFA 108 (90 BASE) MCG/ACT IN AERS
4.0000 | INHALATION_SPRAY | Freq: Once | RESPIRATORY_TRACT | Status: AC
  Administered 2017-05-28: 4 via RESPIRATORY_TRACT
  Filled 2017-05-28: qty 6.7

## 2017-05-28 NOTE — ED Notes (Signed)
Pt discharged to home with family. NAD.  

## 2017-05-28 NOTE — ED Triage Notes (Addendum)
C/o bilat LE swelling-started this am-denies hx of same-denies CP/SOB-NAD-steady gait

## 2017-05-28 NOTE — Discharge Instructions (Signed)
Elevate your legs while sleeping and at rest. Use compression stockings while up and about.

## 2017-05-28 NOTE — ED Provider Notes (Signed)
[facility] EMERGENCY DEPARTMENT Provider Note  CSN: [id number] Arrival date & time: 05/28/17 1924  Chief Complaint(s) Leg Swelling  HPI Lance Ball is a 32 y.o. male   The history is provided by the patient.    CC: leg swelling  Onset/Duration: 14 hrs ago is when he noticed it.  Timing: constant Location: BLE Quality: swelling Severity: moderate Modifying Factors:  Improved by: nothing  Worsened by: nothing Associated Signs/Symptoms:  Pertinent (+): nothing  Pertinent (-): fever, chills, CP, SOB,  Context: has happened in the past but not this amount.   Past Medical History Past Medical History:  Diagnosis Date  . Asthma    There are no active problems to display for this patient.  Home Medication(s) Prior to Admission medications   Not on File                                                                                                                                    Past Surgical History History reviewed. No pertinent surgical history. Family History No family history on file.  Social History Social History   Tobacco Use  . Smoking status: Current Every Day Smoker    Types: Cigarettes  . Smokeless tobacco: Never Used  . Tobacco comment: 2-3 cigarettes/week  Substance Use Topics  . Alcohol use: Yes    Comment: social  . Drug use: Yes    Types: Marijuana   Allergies Patient has no known allergies.  Review of Systems Review of Systems All other systems are reviewed and are negative for acute change except as noted in the HPI  Physical Exam Vital Signs  I have reviewed the triage vital signs BP 137/87 (BP Location: Right Arm)   Pulse 90   Temp 98 F (36.7 C) (Oral)   Resp 16   Ht 5\' 11"  (1.803 m)   Wt 78.9 kg (174 lb)   SpO2 99%   BMI 24.27 kg/m   Physical Exam  Constitutional: He is oriented to person, place, and time. He appears well-developed and well-nourished. No distress.  HENT:  Head: Normocephalic and  atraumatic.  Nose: Nose normal.  Eyes: Conjunctivae and EOM are normal. Pupils are equal, round, and reactive to light. Right eye exhibits no discharge. Left eye exhibits no discharge. No scleral icterus.  Neck: Normal range of motion. Neck supple.  Cardiovascular: Normal rate and regular rhythm. Exam reveals no gallop and no friction rub.  No murmur heard. Pulmonary/Chest: Effort normal. No stridor. No respiratory distress. He has wheezes (mild). He has no rales.  Abdominal: Soft. He exhibits no distension. There is no tenderness.  Musculoskeletal: He exhibits no edema or tenderness.  1+ BLE edema to mid shin  Neurological: He is alert and oriented to person, place, and time.  Skin: Skin is warm and dry. No rash noted. He is not diaphoretic. No erythema.  Psychiatric: He has a normal mood and affect.  Vitals reviewed.   ED Results and Treatments Labs (all labs ordered are listed, but only abnormal results are displayed) Labs Reviewed  COMPREHENSIVE METABOLIC PANEL - Abnormal; Notable for the following components:      Result Value   Glucose, Bld 104 (*)    Calcium 8.4 (*)    Total Protein 5.2 (*)    Albumin 3.2 (*)    ALT 11 (*)    All other components within normal limits  CBC WITH DIFFERENTIAL/PLATELET - Abnormal; Notable for the following components:   RBC 4.08 (*)    Hemoglobin 12.1 (*)    HCT 33.2 (*)    MCHC 36.4 (*)    Lymphs Abs 5.0 (*)    All other components within normal limits                                                                                                                         EKG  EKG Interpretation  Date/Time:    Ventricular Rate:    PR Interval:    QRS Duration:   QT Interval:    QTC Calculation:   R Axis:     Text Interpretation:        Radiology No results found. Pertinent labs & imaging results that were available during my care of the patient were reviewed by me and considered in my medical decision making (see chart for  details).  Medications Ordered in ED Medications  albuterol (PROVENTIL HFA;VENTOLIN HFA) 108 (90 Base) MCG/ACT inhaler 4 puff (4 puffs Inhalation Given 05/28/17 2302)                                                                                                                                    Procedures Procedures  (including critical care time)  Medical Decision Making / ED Course I have reviewed the nursing notes for this encounter and the patient's prior records (if available in EHR or on provided paperwork).    Patient presents with peripheral edema.  Labs without renal insufficiency or significant hypoproteinemia.  Symptoms not concerning for heart failure.  Likely dependent. Recommended elevation and compression stockings.  Patient also found to have wheezing with a history of asthma.  Provided with 4 puffs of albuterol resulting in resolution of the wheezing.   The patient appears reasonably screened and/or stabilized for discharge and I doubt any other medical condition or other [facility] requiring  further screening, evaluation, or treatment in the ED at this time prior to discharge.  The patient is safe for discharge with strict return precautions.   Final Clinical Impression(s) / ED Diagnoses Final diagnoses:  Bilateral lower extremity edema  Mild intermittent asthma, unspecified whether complicated    Disposition: Discharge  Condition: Good  I have discussed the results, Dx and Tx plan with the patient who expressed understanding and agree(s) with the plan. Discharge instructions discussed at great length. The patient was given strict return precautions who verbalized understanding of the instructions. No further questions at time of discharge.    ED Discharge Orders    None       Follow Up: Primary care provider  Schedule an appointment as soon as possible for a visit  As needed    This chart was dictated using voice recognition software.  Despite best  efforts to proofread,  errors can occur which can change the documentation meaning.   Nira Conn, MD 05/28/17 2340

## 2018-01-08 ENCOUNTER — Other Ambulatory Visit: Payer: Self-pay

## 2018-01-08 ENCOUNTER — Emergency Department (HOSPITAL_BASED_OUTPATIENT_CLINIC_OR_DEPARTMENT_OTHER)
Admission: EM | Admit: 2018-01-08 | Discharge: 2018-01-08 | Disposition: A | Discharge status: Home / Self Care | Payer: Managed Care, Other (non HMO) | Attending: Emergency Medicine | Admitting: Emergency Medicine

## 2018-01-08 ENCOUNTER — Encounter (HOSPITAL_BASED_OUTPATIENT_CLINIC_OR_DEPARTMENT_OTHER): Payer: Self-pay | Admitting: Emergency Medicine

## 2018-01-08 DIAGNOSIS — R1084 Generalized abdominal pain: Secondary | ICD-10-CM | POA: Insufficient documentation

## 2018-01-08 DIAGNOSIS — R109 Unspecified abdominal pain: Secondary | ICD-10-CM | POA: Diagnosis present

## 2018-01-08 DIAGNOSIS — J45909 Unspecified asthma, uncomplicated: Secondary | ICD-10-CM | POA: Diagnosis not present

## 2018-01-08 DIAGNOSIS — F17210 Nicotine dependence, cigarettes, uncomplicated: Secondary | ICD-10-CM | POA: Diagnosis not present

## 2018-01-08 LAB — COMPREHENSIVE METABOLIC PANEL
ALK PHOS: 45 U/L (ref 38–126)
ALT: 12 U/L (ref 0–44)
AST: 18 U/L (ref 15–41)
Albumin: 3.5 g/dL (ref 3.5–5.0)
Anion gap: 7 (ref 5–15)
BUN: 16 mg/dL (ref 6–20)
CALCIUM: 8.8 mg/dL — AB (ref 8.9–10.3)
CHLORIDE: 106 mmol/L (ref 98–111)
CO2: 26 mmol/L (ref 22–32)
CREATININE: 1.03 mg/dL (ref 0.61–1.24)
GFR calc Af Amer: >60 mL/min (ref >60)
Glucose, Bld: 110 mg/dL — ABNORMAL HIGH (ref 70–99)
Potassium: 3.9 mmol/L (ref 3.5–5.1)
SODIUM: 139 mmol/L (ref 135–145)
Total Bilirubin: 1.1 mg/dL (ref 0.3–1.2)
Total Protein: 5.7 g/dL — ABNORMAL LOW (ref 6.5–8.1)

## 2018-01-08 LAB — CBC
HCT: 38.5 % — ABNORMAL LOW (ref 39.0–52.0)
Hemoglobin: 14.4 g/dL (ref 13.0–17.0)
MCH: 30.4 pg (ref 26.0–34.0)
MCHC: 37.4 g/dL — ABNORMAL HIGH (ref 30.0–36.0)
MCV: 81.2 fL (ref 78.0–100.0)
PLATELETS: 320 10*3/uL (ref 150–400)
RBC: 4.74 MIL/uL (ref 4.22–5.81)
RDW: 12.9 % (ref 11.5–15.5)
WBC: 12 10*3/uL — ABNORMAL HIGH (ref 4.0–10.5)

## 2018-01-08 LAB — LIPASE, BLOOD: LIPASE: 63 U/L — AB (ref 11–51)

## 2018-01-08 MED ORDER — FAMOTIDINE 20 MG PO TABS: 20.0000 mg | ORAL_TABLET | Freq: Two times a day (BID) | ORAL | 0 refills | Status: DC

## 2018-01-08 MED ORDER — FAMOTIDINE 20 MG PO TABS
20.0000 mg | ORAL_TABLET | Freq: Once | ORAL | Status: AC
  Administered 2018-01-08: 20 mg via ORAL
  Filled 2018-01-08: qty 1

## 2018-01-08 MED ORDER — ALBUTEROL SULFATE HFA 108 (90 BASE) MCG/ACT IN AERS
2.0000 | INHALATION_SPRAY | Freq: Once | RESPIRATORY_TRACT | Status: AC
  Administered 2018-01-08: 2 via RESPIRATORY_TRACT
  Filled 2018-01-08: qty 6.7

## 2018-01-08 NOTE — ED Triage Notes (Signed)
Pt c/o 10/10 abd pain for the past month getting worse today, with nausea and vomiting. Pt  states he has a Hx of IBS.

## 2018-01-08 NOTE — ED Notes (Signed)
ED Provider at bedside. 

## 2018-01-08 NOTE — ED Provider Notes (Signed)
[facility] EMERGENCY DEPARTMENT Provider Note   CSN: [id number] Arrival date & time: 01/08/18  [id number]     History   Chief Complaint Chief Complaint  Patient presents with  . Abdominal Pain    HPI Lance Ball is a 32 y.o. male.  The history is provided by the patient.  Abdominal Pain   This is a recurrent problem. The current episode started more than 1 week ago. The problem has been resolved. The pain is associated with alcohol use. The pain is located in the generalized abdominal region. The pain is severe. Associated symptoms include diarrhea, nausea and vomiting. Pertinent negatives include hematochezia and melena. The symptoms are aggravated by palpation. Nothing relieves the symptoms.   Pt Reports a long history of abdominal pain. He reports when he used to live in [place] he would have episodes of abdominal pain and was supposed to have full evaluation by gastroenterology.  He reports due to lack of insurance, he never followed up.  Since that time he will have intermittent episodes of pain and vomiting.  This episode started several days ago he feels fairly severe abdominal pain has had one episode of vomiting and diarrhea.  He reports he may have IBS. He denies NSAID overuse, but does admit to alcohol use.  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 Asthma     There are no active problems to display for this patient.   History reviewed. No pertinent surgical history.      Home Medications    Prior to Admission medications   Not on File    Family History History reviewed. No pertinent family history.  Social History Social History   Tobacco Use  . Smoking status: Current Every Day Smoker    Packs/day: 0.50    Types: Cigarettes  . Smokeless tobacco: Never Used  . Tobacco comment: 2-3 cigarettes/week  Substance Use Topics  . Alcohol use: Yes   Comment: social  . Drug use: Yes    Types: Marijuana     Allergies   Patient has no known allergies.   Review of Systems Review of Systems  Gastrointestinal: Positive for abdominal pain, diarrhea, nausea and vomiting. Negative for blood in stool, hematochezia and melena.  Skin:       Warts   All other systems reviewed and are negative.    Physical Exam Updated Vital Signs BP (!) 128/98 (BP Location: Right Arm)   Pulse 86   Temp 98.1 F (36.7 C) (Oral)   Resp 18   Ht 1.803 m (5\' 11" )   Wt 75.3 kg   SpO2 98%   BMI 23.15 kg/m   Physical Exam CONSTITUTIONAL: Well developed/well nourished HEAD: Normocephalic/atraumatic EYES: EOMI/PERRL no icterus ENMT: Mucous membranes moist NECK: supple no meningeal signs SPINE/BACK:entire spine nontender CV: S1/S2 noted, no murmurs/rubs/gallops noted LUNGS: Lungs are clear to auscultation bilaterally, no apparent distress ABDOMEN: soft, nontender, no rebound or guarding, bowel sounds noted throughout abdomen GU:no cva tenderness NEURO: Pt is awake/alert/appropriate, moves all extremitiesx4.  No facial droop.   EXTREMITIES: pulses normal/equal, full ROM SKIN: warm, color normal PSYCH: no abnormalities of mood noted, alert and oriented to situation   ED Treatments / Results  Labs (all labs ordered are listed, but only abnormal results are displayed) Labs Reviewed  LIPASE, BLOOD - Abnormal; Notable for the following components:      Result Value  Lipase 63 (*)    All other components within normal limits  COMPREHENSIVE METABOLIC PANEL - Abnormal; Notable for the following components:   Glucose, Bld 110 (*)    Calcium 8.8 (*)    Total Protein 5.7 (*)    All other components within normal limits  CBC - Abnormal; Notable for the following components:   WBC 12.0 (*)    HCT 38.5 (*)    MCHC 37.4 (*)    All other components within normal limits    EKG None  Radiology No results found.  Procedures Procedures    Medications Ordered in ED Medications  famotidine (PEPCID) tablet 20 mg (20 mg Oral Given 01/08/18 0410)     Initial Impression / Assessment and Plan / ED Course  I have reviewed the triage vital signs and the nursing notes.  Pertinent labs results that were available during my care of the patient were reviewed by me and considered in my medical decision making (see chart for details).     Patient is pain-free at this time.  He reports similar episodes in the past.  I am suspicious that alcohol is playing a key role in his pain.  Will advise to avoid alcohol, also avoid NSAIDs.  He has mild elevation of his lipase which could indicate recent flare of pancreatitis Will refer to gastroenterology as well as a primary care doctor.  His genital warts can be managed as an outpatient.  Final Clinical Impressions(s) / ED Diagnoses   Final diagnoses:  Generalized abdominal pain    ED Discharge Orders         Ordered    famotidine (PEPCID) 20 MG tablet  2 times daily     01/08/18 0445           Zadie RhineWickline, Juanita Streight, MD 01/08/18 0448

## 2018-01-08 NOTE — Discharge Instructions (Signed)

## 2018-01-08 NOTE — ED Notes (Signed)
Pt given a warm blanket and apple juice

## 2018-01-30 ENCOUNTER — Ambulatory Visit (INDEPENDENT_AMBULATORY_CARE_PROVIDER_SITE_OTHER): Payer: Managed Care, Other (non HMO) | Admitting: Physician Assistant

## 2018-01-30 ENCOUNTER — Encounter: Payer: Self-pay | Admitting: Physician Assistant

## 2018-01-30 VITALS — BP 129/86 | HR 66 | Ht 71.0 in | Wt 170.0 lb

## 2018-01-30 DIAGNOSIS — A630 Anogenital (venereal) warts: Secondary | ICD-10-CM

## 2018-01-30 DIAGNOSIS — Z7689 Persons encountering health services in other specified circumstances: Secondary | ICD-10-CM | POA: Diagnosis not present

## 2018-01-30 NOTE — Patient Instructions (Addendum)
Cryosurgery for Skin Conditions, Care After This sheet gives you information about how to care for yourself after your procedure. Your health care provider may also give you more specific instructions. If you have problems or questions, contact your health care provider. What can I expect after the procedure? After your procedure, it is common to have redness, swelling, and a blister that forms over the treated area. The blister may contain a small amount of blood. After about 2 weeks, the blister will break on its own, leaving a scab. Then the treated area will heal. After healing, there is usually little or no scarring. Follow these instructions at home: Caring for the treated area  Follow instructions from your health care provider about how to take care of the treated area. Make sure you: ? Keep the area covered with a bandage (dressing) until it heals, or for as long as told by your health care provider. ? Wash your hands with soap and water before you change your dressing. If soap and water are not available, use hand sanitizer. ? Change your dressing as told by your health care provider. ? Keep the dressing and the treated area clean and dry. If the dressing gets wet, change it right away. ? Clean the treated area with soap and water.  Check the treated area every day for signs of infection. Check for: ? More redness, swelling, or pain. ? More fluid or blood. ? Warmth. ? Pus or a bad smell. General instructions  Do not pick at your blister or try to break it open. This can cause infection and scarring.  Do not apply any medicine, cream, or lotion to the treated area unless directed by your health care provider.  Take over-the-counter and prescription medicines only as told by your health care provider.  Keep all follow-up visits as told by your health care provider. This is important. Contact a health care provider if:  You have more redness, swelling, or pain around the treated  area.  You have more fluid or blood coming from the treated area.  The treated area feels warm to the touch.  You have pus or a bad smell coming from the treated area.  Your blister becomes large and painful. Get help right away if:  You have a fever and have redness spreading from the treated area. Summary  The treated area will become red and swollen shortly after the procedure.  You should keep the treated area and your dressing clean and dry.  Check the treated area every day for signs of infection, such as fluid, pus, warmth, or having more redness, swelling, or pain.  Do not pick at your blister or try to break it open. This information is not intended to replace advice given to you by your health care provider. Make sure you discuss any questions you have with your health care provider. Document Released: 11/24/2004 Document Revised: 03/26/2016 Document Reviewed: 03/26/2016 Elsevier Interactive Patient Education  2017 Elsevier Inc.    Genital Warts Genital warts are a common STD (sexually transmitted disease). They may appear as small bumps on the tissues of the genital area or anal area. Sometimes, they can become irritated and cause pain. Genital warts are easily passed to other people through sexual contact. Getting treatment is important because genital warts can lead to other problems. In females, the virus that causes genital warts may increase the risk of cervical cancer. What are the causes? Genital warts are caused by a virus that is   called human papillomavirus (HPV). HPV is spread by having unprotected sex with an infected person. It can be spread through vaginal, anal, and oral sex. Many people do not know that they are infected. They may be infected for years without problems. However, even if they do not have problems, they can pass the infection to their sexual partners. What increases the risk? Genital warts are more likely to develop in:  People who have  unprotected sex.  People who have multiple sexual partners.  People who become sexually active before they are 33 years of age.  Men who are not circumcised.  Women who have a male sexual partner who is not circumcised.  People who have a weakened body defense system (immune system) due to disease or medicine.  People who smoke.  What are the signs or symptoms? Symptoms of genital warts include:  Small growths in the genital area or anal area. These warts often grow in clusters.  Itching and irritation in the genital area or anal area.  Bleeding from the warts.  Painful sexual intercourse.  How is this diagnosed? Genital warts can usually be diagnosed from their appearance on the vagina, vulva, penis, perineum, anus, or rectum. Tests may also be done, such as:  Biopsy. A tissue sample is removed so it can be looked at under a microscope.  Colposcopy. In females, a magnifying tool is used to examine the vagina and cervix. Certain solutions may be used to make the HPV cells change color so they can be seen more easily.  A Pap test in females.  Tests for other STDs.  How is this treated? Treatment for genital warts may include:  Applying prescription medicines to the warts. These may be solutions or creams.  Freezing the warts with liquid nitrogen (cryotherapy).  Burning the warts with: ? Laser treatment. ? An electrified probe (electrocautery).  Injecting a substance (Candida antigen or Trichophyton antigen) into the warts to help the body's immune system to fight off the warts.  Interferon injections.  Surgery to remove the warts.  Follow these instructions at home: Medicines  Apply over-the-counter and prescription medicines only as told by your health care provider.  Do not treat genital warts with medicines that are used for treating hand warts.  Talk with your health care provider about using over-the-counter anti-itch creams. General instructions  Do  not touch or scratch the warts.  Do not have sex until your treatment has been completed.  Tell your current and past sexual partners about your condition because they may also need treatment.  Keep all follow-up visits as told by your health care provider. This is important.  After treatment, use condoms during sex to prevent future infections. Other Instructions for Women  Women who have genital warts might need increased screening for cervical cancer. This type of cancer is slow growing and can be cured if it is found early. Chances of developing cervical cancer are increased with HPV.  If you become pregnant, tell your health care provider that you have had HPV. Your health care provider will monitor you closely during pregnancy to be sure that your baby is safe. How is this prevented? Talk with your health care provider about getting the HPV vaccines. These vaccines prevent some HPV infections and cancers. It is recommended that the vaccine be given to males and females who are 9-26 years of age. It will not work if you already have HPV, and it is not recommended for pregnant women. Contact a   health care provider if:  You have redness, swelling, or pain in the area of the treated skin.  You have a fever.  You feel generally ill.  You feel lumps in and around your genital area or anal area.  You have bleeding in your genital area or anal area.  You have pain during sexual intercourse. This information is not intended to replace advice given to you by your health care provider. Make sure you discuss any questions you have with your health care provider. Document Released: 05/04/2000 Document Revised: 10/13/2015 Document Reviewed: 08/02/2014 Elsevier Interactive Patient Education  2018 Elsevier Inc.  

## 2018-01-30 NOTE — Progress Notes (Signed)
HPI:                                                                Lance Ball is a 32 y.o. male who presents to [name]: Primary Care [facility] today to establish care  Current concerns: HPV  He will be seeing Dr. 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 He has done liquid nitrogen therapy in the past and would like to have this done today if possible. He is sexually active with 1 male partner. Denies history of STI apart from HPV.  Depression screen [facility] 2/9 01/30/2018  Decreased Interest 0  Down, Depressed, Hopeless 0  PHQ - 2 Score 0  Altered sleeping 3  Tired, decreased energy 3  Change in appetite 2  Feeling bad or failure about yourself  1  Trouble concentrating 0  Moving slowly or fidgety/restless 0  Suicidal thoughts 0  PHQ-9 Score 9    GAD 7 : Generalized Anxiety Score 01/30/2018  Nervous, Anxious, on Edge 1  Control/stop worrying 3  Worry too much - different things 3  Trouble relaxing 2  Restless 0  Easily annoyed or irritable 0  Afraid - awful might happen 0  Total GAD 7 Score 9      Past Medical History:  Diagnosis Date  . Asthma    Past Surgical History:  Procedure Laterality Date  . NO PAST SURGERIES     Social History   Tobacco Use  . Smoking status: Current Every Day Smoker    Packs/day: 0.50    Types: Cigarettes  . Smokeless tobacco: Never Used  . Tobacco comment: 2-3 cigarettes/week  Substance Use Topics  . Alcohol use: Yes    Alcohol/week: 2.0 standard drinks    Types: 2 Standard drinks or equivalent per week    Comment: social   Family history is unknown by patient.    ROS: negative except as noted in the HPI  Medications: No current outpatient medications on file.   No current facility-administered medications for this visit.    No Known  Allergies     Objective:  BP 129/86   Pulse 66   Ht 5\' 11"  (1.803 m)   Wt 170 lb (77.1 kg)   BMI 23.71 kg/m  Gen:  alert, not ill-appearing, no distress, appropriate for age HEENT: head normocephalic without obvious abnormality, conjunctiva and cornea clear, trachea midline Pulm: Normal work of breathing, normal phonation Neuro: alert and oriented x 3, no tremor MSK: extremities atraumatic, normal gait and station Skin: approx 1 cm irregular, hyperkeratotic papule of the mons pubis, approx 4 mm papule at the right lateral base of the penis Psych: well-groomed, cooperative, good eye contact, euthymic mood, affect mood-congruent, speech is articulate, and thought processes clear and goal-directed  A chaperone was used for physical exam and cryotherapy, [name], CMA  No results found for this or any previous visit (from the past 72 hour(s)). No results found.  Procedure: Cryodestruction of genital condyloma Indication: infection Risks and benefits of procedures discussed. Verbal consent obtained Area examined and noted no overlying erythema, induration, or other signs of local  skin infection. Using cryospray method, the canister was held in an upright position with the cryotip approximately 1 cm away from the lesion.  Freezing performed using liquid nitrogen until 1-2 mm rim of frost appeared around the lesion.  Freeze-thaw cycle repeated twice.  Follow-up: The patient tolerated the procedure well without complications.  Standard post-procedure care is explained and return precautions are given. Advised to return in 1-2 weeks for repeat cryodestruction if lesion is still present/symptomatic.   Assessment and Plan: 32 y.o. male with   .[name] was seen today for establish care.  Diagnoses and all orders for this visit:  Encounter to establish care  Genital condyloma, male   - Personally reviewed PMH, PSH, PFH, medications, allergies, HM - Age-appropriate cancer  screening: n/a - Influenza declined - Tdap UTD - PHQ2 negative  - cryotherapy performed in office today - declined STI screening - counseled on HPV transmission and prevention. He was offered the HPV vaccine, but declined due to cost   Patient education and anticipatory guidance given Patient agrees with treatment plan Follow-up in 2 weeks for repeat cryotherapy or sooner as needed if symptoms worsen or fail to improve  Levonne Hubertharley E. Dozier Berkovich PA-C

## 2018-11-29 ENCOUNTER — Other Ambulatory Visit: Payer: Self-pay

## 2018-11-29 ENCOUNTER — Emergency Department (HOSPITAL_BASED_OUTPATIENT_CLINIC_OR_DEPARTMENT_OTHER)
Admission: EM | Admit: 2018-11-29 | Discharge: 2018-11-29 | Disposition: A | Discharge status: Home / Self Care | Payer: BC Managed Care – PPO | Attending: Emergency Medicine | Admitting: Emergency Medicine

## 2018-11-29 ENCOUNTER — Encounter (HOSPITAL_BASED_OUTPATIENT_CLINIC_OR_DEPARTMENT_OTHER): Payer: Self-pay | Admitting: *Deleted

## 2018-11-29 DIAGNOSIS — R2242 Localized swelling, mass and lump, left lower limb: Secondary | ICD-10-CM | POA: Diagnosis present

## 2018-11-29 DIAGNOSIS — L02214 Cutaneous abscess of groin: Secondary | ICD-10-CM | POA: Insufficient documentation

## 2018-11-29 DIAGNOSIS — F17210 Nicotine dependence, cigarettes, uncomplicated: Secondary | ICD-10-CM | POA: Diagnosis not present

## 2018-11-29 DIAGNOSIS — L0291 Cutaneous abscess, unspecified: Secondary | ICD-10-CM

## 2018-11-29 DIAGNOSIS — J45909 Unspecified asthma, uncomplicated: Secondary | ICD-10-CM | POA: Diagnosis not present

## 2018-11-29 MED ORDER — ALBUTEROL SULFATE HFA 108 (90 BASE) MCG/ACT IN AERS: 1.0000 | INHALATION_SPRAY | RESPIRATORY_TRACT | 0 refills | Status: DC | PRN

## 2018-11-29 MED ORDER — SULFAMETHOXAZOLE-TRIMETHOPRIM 800-160 MG PO TABS: 1.0000 | ORAL_TABLET | Freq: Two times a day (BID) | ORAL | 0 refills | Status: AC

## 2018-11-29 NOTE — Discharge Instructions (Addendum)
Bactrim as prescribed.  Apply warm compresses as frequently as possible for the next several days.  Return to the emergency department in the meantime if you develop increased pain, swelling, high fever, or other new and concerning symptoms.

## 2018-11-29 NOTE — ED Provider Notes (Signed)
[facility] EMERGENCY DEPARTMENT Provider Note   CSN: [id number] Arrival date & time: 11/29/18  2136     History   Chief Complaint Chief Complaint  Patient presents with  . Abscess    HPI Lance Ball is a 33 y.o. male.     Patient is a 33 year old male with past medical history of asthma.  He presents today for evaluation of a boil to his left groin.  This is worsened over the past several days.  He denies any fevers or chills.  Patient has attempted to squeeze it, however this has not helped.  The history is provided by the patient.  Abscess Abscess location: Left groin. Abscess quality: induration   Duration:  3 days Progression:  Worsening Chronicity:  New Relieved by:  Nothing Worsened by:  Nothing Ineffective treatments:  None tried   Past Medical History:  Diagnosis Date  . Asthma     Patient Active Problem List   Diagnosis Date Noted  . 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 NO PAST SURGERIES          Home Medications    Prior to Admission medications   Not on File    Family History Family History  Family history unknown: Yes    Social History Social History   Tobacco Use  . Smoking status: Current Every Day Smoker    Packs/day: 0.50    Types: Cigarettes  . Smokeless tobacco: Never Used  . Tobacco comment: 2-3 cigarettes/week  Substance Use Topics  . Alcohol use: Yes    Alcohol/week: 2.0 standard drinks    Types: 2 Standard drinks or equivalent per week    Comment: social  . Drug use: Yes    Types: Marijuana     Allergies   Patient has no known allergies.   Review of Systems Review of Systems  All other systems reviewed and are negative.    Physical Exam Updated Vital Signs BP (!) 152/88 (BP Location: Right Arm)   Pulse 77   Temp 98.7 F (37.1 C) (Oral)   Resp 18   Ht 5\' 11"  (1.803 m)   Wt 79.4 kg   SpO2 97%   BMI 24.41 kg/m   Physical Exam  Vitals signs and nursing note reviewed.  Constitutional:      General: He is not in acute distress.    Appearance: Normal appearance. He is not ill-appearing.  HENT:     Head: Normocephalic and atraumatic.  Pulmonary:     Effort: Pulmonary effort is normal.  Genitourinary:    Penis: Normal.      Scrotum/Testes: Normal.     Comments: There is a small, less than 1 cm round, indurated area to the left groin in the crease of the leg adjacent to the left testicle.  There is no fluctuance and no purulent drainage. Skin:    General: Skin is warm and dry.      ED Treatments / Results  Labs (all labs ordered are listed, but only abnormal results are displayed) Labs Reviewed - No data to display  EKG None  Radiology No results found.  Procedures Procedures (including critical care time)  Medications Ordered in ED Medications - No data to display   Initial Impression / Assessment and Plan / ED Course  I have reviewed the triage vital signs and the nursing notes.  Pertinent labs & imaging results that were available during my care of the  patient were reviewed by me and considered in my medical decision making (see chart for details).  This appears to be a small possible developing abscess/ingrown hair.  This will be treated with Bactrim and warm soaks.  I see no indication at this time for incision and drainage as this is very small and there is no fluctuance present.  Patient also requesting an inhaler.  He will be given a prescription for albuterol.  Final Clinical Impressions(s) / ED Diagnoses   Final diagnoses:  None    ED Discharge Orders    None       Geoffery Lyonselo, Bliss Tsang, MD 11/29/18 2227

## 2018-11-29 NOTE — ED Triage Notes (Signed)
Pt reports abscess left groin x 3 days

## 2018-11-29 NOTE — ED Notes (Signed)
Pt c/o abscess in groin area on left side x 3 days. Pt verbalizes hx in general area. Pt states abscess not responding to hot compresses or epsom salt soaks. Pt denies otc medication pta.

## 2019-02-13 ENCOUNTER — Encounter (HOSPITAL_BASED_OUTPATIENT_CLINIC_OR_DEPARTMENT_OTHER): Payer: Self-pay | Admitting: *Deleted

## 2019-02-13 ENCOUNTER — Other Ambulatory Visit: Payer: Self-pay

## 2019-02-13 ENCOUNTER — Emergency Department (HOSPITAL_BASED_OUTPATIENT_CLINIC_OR_DEPARTMENT_OTHER): Payer: BC Managed Care – PPO

## 2019-02-13 ENCOUNTER — Emergency Department (HOSPITAL_BASED_OUTPATIENT_CLINIC_OR_DEPARTMENT_OTHER)
Admission: EM | Admit: 2019-02-13 | Discharge: 2019-02-13 | Disposition: A | Discharge status: Home / Self Care | Payer: BC Managed Care – PPO | Attending: Emergency Medicine | Admitting: Emergency Medicine

## 2019-02-13 DIAGNOSIS — Z79899 Other long term (current) drug therapy: Secondary | ICD-10-CM | POA: Diagnosis not present

## 2019-02-13 DIAGNOSIS — J069 Acute upper respiratory infection, unspecified: Secondary | ICD-10-CM | POA: Diagnosis not present

## 2019-02-13 DIAGNOSIS — J4541 Moderate persistent asthma with (acute) exacerbation: Secondary | ICD-10-CM | POA: Diagnosis not present

## 2019-02-13 DIAGNOSIS — F17210 Nicotine dependence, cigarettes, uncomplicated: Secondary | ICD-10-CM | POA: Insufficient documentation

## 2019-02-13 DIAGNOSIS — R05 Cough: Secondary | ICD-10-CM | POA: Diagnosis present

## 2019-02-13 MED ORDER — IBUPROFEN 400 MG PO TABS
400.0000 mg | ORAL_TABLET | Freq: Once | ORAL | Status: AC
  Administered 2019-02-13: 16:00:00 400 mg via ORAL
  Filled 2019-02-13: qty 1

## 2019-02-13 MED ORDER — ALBUTEROL SULFATE HFA 108 (90 BASE) MCG/ACT IN AERS
INHALATION_SPRAY | RESPIRATORY_TRACT | Status: AC
  Administered 2019-02-13: 8 via RESPIRATORY_TRACT
  Filled 2019-02-13: qty 6.7

## 2019-02-13 MED ORDER — ALBUTEROL SULFATE HFA 108 (90 BASE) MCG/ACT IN AERS: 8.0000 | INHALATION_SPRAY | RESPIRATORY_TRACT | Status: DC | PRN

## 2019-02-13 MED ORDER — PREDNISONE 50 MG PO TABS
60.0000 mg | ORAL_TABLET | Freq: Once | ORAL | Status: AC
  Administered 2019-02-13: 16:00:00 60 mg via ORAL
  Filled 2019-02-13: qty 1

## 2019-02-13 MED ORDER — IPRATROPIUM BROMIDE HFA 17 MCG/ACT IN AERS
4.0000 | INHALATION_SPRAY | Freq: Once | RESPIRATORY_TRACT | Status: AC
  Administered 2019-02-13: 17:00:00 4 via RESPIRATORY_TRACT

## 2019-02-13 MED ORDER — ALBUTEROL SULFATE HFA 108 (90 BASE) MCG/ACT IN AERS
8.0000 | INHALATION_SPRAY | RESPIRATORY_TRACT | Status: DC | PRN
  Administered 2019-02-13: 16:00:00 4 via RESPIRATORY_TRACT
  Administered 2019-02-13: 17:00:00 8 via RESPIRATORY_TRACT

## 2019-02-13 MED ORDER — PREDNISONE 20 MG PO TABS: 40.0000 mg | ORAL_TABLET | Freq: Every day | ORAL | 0 refills | Status: DC

## 2019-02-13 MED ORDER — ACETAMINOPHEN 325 MG PO TABS
650.0000 mg | ORAL_TABLET | Freq: Once | ORAL | Status: AC
  Administered 2019-02-13: 16:00:00 650 mg via ORAL
  Filled 2019-02-13: qty 2

## 2019-02-13 MED ORDER — IPRATROPIUM BROMIDE HFA 17 MCG/ACT IN AERS
4.0000 | INHALATION_SPRAY | Freq: Once | RESPIRATORY_TRACT | Status: AC
  Administered 2019-02-13: 4 via RESPIRATORY_TRACT
  Filled 2019-02-13: qty 12.9

## 2019-02-13 MED ORDER — ALBUTEROL SULFATE HFA 108 (90 BASE) MCG/ACT IN AERS
8.0000 | INHALATION_SPRAY | Freq: Once | RESPIRATORY_TRACT | Status: AC
  Administered 2019-02-13: 15:00:00 8 via RESPIRATORY_TRACT

## 2019-02-13 NOTE — ED Provider Notes (Signed)
[facility] EMERGENCY DEPARTMENT Provider Note   CSN: [id number] Arrival date & time: 02/13/19  1450     History   Chief Complaint Chief Complaint  Patient presents with  . URI    HPI Charli Liberatore is a 33 y.o. male.     The history is provided by the patient.  URI Presenting symptoms: congestion, cough and rhinorrhea   Presenting symptoms comment:  Chills, epistaxis, shortness of breath and productive cough with yellow mucus Severity:  Moderate Onset quality:  Gradual Duration:  3 days Timing:  Constant Progression:  Worsening Chronicity:  New Relieved by:  Nothing Worsened by:  Breathing and movement Ineffective treatments:  Inhaler and OTC medications Associated symptoms: myalgias and wheezing   Risk factors: no recent travel and no sick contacts   Risk factors comment:  Asthmatic and smokes marijuana   Past Medical History:  Diagnosis Date  . Asthma     Patient Active Problem List   Diagnosis Date Noted  . 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 NO PAST SURGERIES          Home Medications    Prior to Admission medications   Medication Sig Start Date End Date Taking? Authorizing Provider  albuterol (VENTOLIN HFA) 108 (90 Base) MCG/ACT inhaler Inhale 1-2 puffs into the lungs every 4 (four) hours as needed for wheezing or shortness of breath. 11/29/18   Veryl Speak, MD    Family History Family History  Family history unknown: Yes    Social History Social History   Tobacco Use  . Smoking status: Current Every Day Smoker    Packs/day: 0.50    Types: Cigarettes  . Smokeless tobacco: Never Used  . Tobacco comment: 2-3 cigarettes/week  Substance Use Topics  . Alcohol use: Yes    Alcohol/week: 2.0 standard drinks    Types: 2 Standard drinks or equivalent per week    Comment: social  . Drug use: Yes    Types: Marijuana     Allergies   Patient has no known allergies.    Review of Systems Review of Systems  HENT: Positive for congestion and rhinorrhea.   Respiratory: Positive for cough and wheezing.   Musculoskeletal: Positive for myalgias.  All other systems reviewed and are negative.    Physical Exam Updated Vital Signs BP (!) 157/100   Pulse (!) 102   Temp 99.3 F (37.4 C)   Resp 18   Ht 5\' 11"  (1.803 m)   Wt 81.6 kg   SpO2 98%   BMI 25.10 kg/m   Physical Exam Vitals signs and nursing note reviewed.  Constitutional:      General: He is not in acute distress.    Appearance: He is well-developed and normal weight.  HENT:     Head: Normocephalic and atraumatic.     Right Ear: A middle ear effusion is present.     Left Ear: A middle ear effusion is present.     Nose: Mucosal edema and congestion present.     Right Nostril: No epistaxis.     Left Nostril: No epistaxis.     Right Turbinates: Enlarged.     Left Turbinates: Enlarged.  Eyes:     Conjunctiva/sclera: Conjunctivae normal.     Pupils: Pupils are equal, round, and reactive to light.  Neck:     Musculoskeletal: Normal range of motion and neck supple.  Cardiovascular:     Rate and Rhythm: Normal  rate and regular rhythm.     Pulses: Normal pulses.     Heart sounds: No murmur.  Pulmonary:     Effort: Pulmonary effort is normal. No respiratory distress.     Breath sounds: Wheezing present. No rales.  Abdominal:     General: There is no distension.     Palpations: Abdomen is soft.     Tenderness: There is no abdominal tenderness. There is no guarding or rebound.  Musculoskeletal: Normal range of motion.        General: No tenderness.  Skin:    General: Skin is warm and dry.     Findings: No erythema or rash.  Neurological:     Mental Status: He is alert and oriented to person, place, and time.  Psychiatric:        Mood and Affect: Mood normal.        Behavior: Behavior normal.        Thought Content: Thought content normal.      ED Treatments / Results  Labs (all  labs ordered are listed, but only abnormal results are displayed) Labs Reviewed - No data to display  EKG None  Radiology Dg Chest 2 View  Result Date: 02/13/2019 CLINICAL DATA:  Pt having cough and congestion for a few days,hx of asthma EXAM: CHEST - 2 VIEW COMPARISON:  Chest radiograph 07/04/2011 FINDINGS: The heart size and mediastinal contours are within normal limits. The lungs are clear. No pneumothorax or pleural effusion. The visualized skeletal structures are unremarkable. IMPRESSION: No active cardiopulmonary disease. Electronically Signed   By: Emmaline KluverNancy  Ballantyne M.D.   On: 02/13/2019 15:20    Procedures Procedures (including critical care time)  Medications Ordered in ED Medications  albuterol (VENTOLIN HFA) 108 (90 Base) MCG/ACT inhaler 8 puff (4 puffs Inhalation Given 02/13/19 1556)  albuterol (VENTOLIN HFA) 108 (90 Base) MCG/ACT inhaler 8 puff (8 puffs Inhalation Given 02/13/19 1528)  predniSONE (DELTASONE) tablet 60 mg (60 mg Oral Given 02/13/19 1547)  ipratropium (ATROVENT HFA) inhaler 4 puff (4 puffs Inhalation Given 02/13/19 1550)  ibuprofen (ADVIL) tablet 400 mg (400 mg Oral Given 02/13/19 1546)  acetaminophen (TYLENOL) tablet 650 mg (650 mg Oral Given 02/13/19 1547)     Initial Impression / Assessment and Plan / ED Course  I have reviewed the triage vital signs and the nursing notes.  Pertinent labs & imaging results that were available during my care of the patient were reviewed by me and considered in my medical decision making (see chart for details).        Patient presenting today with URI symptoms and wheezing and shortness of breath.  Symptoms have worsened over the last 3 days.  Patient is using his inhaler at home but states it is not helping with his cough and shortness of breath.  On exam patient is wheezing diffusely but in no acute distress.  He is able to speak in full sentences and oxygen saturation is 98% on room air.  Patient's chest x-ray is negative  for evidence of pneumonia.  He has no known COVID contacts and works from home.  Patient was given albuterol and Atrovent as well as prednisone.  12:12 AM After 3 rounds of meds pt's wheezing is now resolved and he is feeling much better.  Pt d/ced home with prednisone.  Final Clinical Impressions(s) / ED Diagnoses   Final diagnoses:  Viral upper respiratory tract infection  Moderate persistent asthma with exacerbation    ED Discharge Orders  Ordered    predniSONE (DELTASONE) 20 MG tablet  Daily     02/13/19 1703           Gwyneth Sprout, MD 02/14/19 0013

## 2019-02-13 NOTE — ED Triage Notes (Signed)
Pt c/o URi symptoms x 3 weeks hx asthma

## 2019-04-03 ENCOUNTER — Encounter (HOSPITAL_BASED_OUTPATIENT_CLINIC_OR_DEPARTMENT_OTHER): Payer: Self-pay | Admitting: *Deleted

## 2019-04-03 ENCOUNTER — Emergency Department (HOSPITAL_BASED_OUTPATIENT_CLINIC_OR_DEPARTMENT_OTHER)
Admission: EM | Admit: 2019-04-03 | Discharge: 2019-04-04 | Disposition: A | Discharge status: Home / Self Care | Payer: BC Managed Care – PPO | Attending: Emergency Medicine | Admitting: Emergency Medicine

## 2019-04-03 ENCOUNTER — Other Ambulatory Visit: Payer: Self-pay

## 2019-04-03 DIAGNOSIS — J45909 Unspecified asthma, uncomplicated: Secondary | ICD-10-CM | POA: Insufficient documentation

## 2019-04-03 DIAGNOSIS — Z202 Contact with and (suspected) exposure to infections with a predominantly sexual mode of transmission: Secondary | ICD-10-CM | POA: Diagnosis not present

## 2019-04-03 DIAGNOSIS — F17210 Nicotine dependence, cigarettes, uncomplicated: Secondary | ICD-10-CM | POA: Insufficient documentation

## 2019-04-03 DIAGNOSIS — Z79899 Other long term (current) drug therapy: Secondary | ICD-10-CM | POA: Insufficient documentation

## 2019-04-03 LAB — URINALYSIS, ROUTINE W REFLEX MICROSCOPIC
Bilirubin Urine: NEGATIVE
Glucose, UA: NEGATIVE mg/dL
Hgb urine dipstick: NEGATIVE
Ketones, ur: NEGATIVE mg/dL
Leukocytes,Ua: NEGATIVE
Nitrite: NEGATIVE
Protein, ur: NEGATIVE mg/dL
Specific Gravity, Urine: 1.01 (ref 1.005–1.030)
pH: 6 (ref 5.0–8.0)

## 2019-04-03 MED ORDER — AZITHROMYCIN 250 MG PO TABS
1000.0000 mg | ORAL_TABLET | Freq: Once | ORAL | Status: AC
  Administered 2019-04-03: 1000 mg via ORAL
  Filled 2019-04-03: qty 4

## 2019-04-03 MED ORDER — CEFTRIAXONE SODIUM 250 MG IJ SOLR
250.0000 mg | Freq: Once | INTRAMUSCULAR | Status: AC
  Administered 2019-04-03: 250 mg via INTRAMUSCULAR
  Filled 2019-04-03: qty 250

## 2019-04-03 MED ORDER — ALBUTEROL SULFATE HFA 108 (90 BASE) MCG/ACT IN AERS: 1.0000 | INHALATION_SPRAY | RESPIRATORY_TRACT | 0 refills | Status: AC | PRN

## 2019-04-03 NOTE — Discharge Instructions (Signed)
Please read and follow all provided instructions.  Your diagnoses today include:  1. STD exposure     Tests performed today include:  Test for gonorrhea and chlamydia.   You will be notified by telephone with any positive results.   Vital signs. See below for your results today.   Medications:  You were treated with azithromycin and rocephin today. These antibiotics treat you for gonorrhea and chlamydia.  Home care instructions:  Read educational materials contained in this packet and follow any instructions provided.   You should tell your partners about your infection and avoid having sex for one week to allow time for the medicine to work.  Sexually transmitted disease testing also available at:   [facility]  [address], [place], phone [phone] or [phone]    Monday - Friday, call for an appointment  [facility]  [address], [place], phone [phone] or [phone]   Monday - Friday, call for an appointment  Return instructions:   Please return to the Emergency Department if you experience worsening symptoms.   Please return if you have any other emergent concerns.  Additional Information:  Your vital signs today were: BP (!) 134/91    Pulse 84    Temp 98.8 F (37.1 C) (Oral)    Resp 20    Ht 5\' 11"  (1.803 m)    Wt 81.6 kg    SpO2 98%    BMI 25.10 kg/m  If your blood pressure (BP) was elevated above 135/85 this visit, please have this repeated by your doctor within one month. --------------

## 2019-04-03 NOTE — ED Triage Notes (Signed)
Std exposure. Pt states he does not have any symptoms but wants to be checked.

## 2019-04-03 NOTE — ED Provider Notes (Addendum)
[facility] EMERGENCY DEPARTMENT Provider Note   CSN: [id number] Arrival date & time: 04/03/19  2112     History   Chief Complaint Chief Complaint  Patient presents with  . Exposure to STD    HPI Lance Ball is a 33 y.o. male.     Patient with history of HPV presents the emergency department with complaint of exposure to gonorrhea.  Patient states that he was exposed several weeks ago.  He denies any symptoms including dysuria or penile discharge.  He denies any skin sores or rashes.  No treatments prior to arrival.  Requesting testing and treatment.     Past Medical History:  Diagnosis Date  . Asthma     Patient Active Problem List   Diagnosis Date Noted  . 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 NO PAST SURGERIES          Home Medications    Prior to Admission medications   Medication Sig Start Date End Date Taking? Authorizing Provider  albuterol (VENTOLIN HFA) 108 (90 Base) MCG/ACT inhaler Inhale 1-2 puffs into the lungs every 4 (four) hours as needed for wheezing or shortness of breath. 11/29/18  Yes Delo, Riley Lam, MD  predniSONE (DELTASONE) 20 MG tablet Take 2 tablets (40 mg total) by mouth daily. 02/13/19   Gwyneth Sprout, MD    Family History Family History  Family history unknown: Yes    Social History Social History   Tobacco Use  . Smoking status: Current Every Day Smoker    Packs/day: 0.50    Types: Cigarettes  . Smokeless tobacco: Never Used  . Tobacco comment: 2-3 cigarettes/week  Substance Use Topics  . Alcohol use: Yes    Alcohol/week: 2.0 standard drinks    Types: 2 Standard drinks or equivalent per week    Comment: social  . Drug use: Yes    Types: Marijuana     Allergies   Patient has no known allergies.   Review of Systems Review of Systems  Constitutional: Negative for fever.  HENT: Negative for sore throat.   Eyes: Negative for discharge.   Gastrointestinal: Negative for rectal pain.  Genitourinary: Negative for discharge, dysuria, frequency, genital sores, penile pain and testicular pain.  Musculoskeletal: Negative for arthralgias.  Skin: Negative for rash.  Hematological: Negative for adenopathy.     Physical Exam Updated Vital Signs BP (!) 134/91   Pulse 84   Temp 98.8 F (37.1 C) (Oral)   Resp 20   Ht 5\' 11"  (1.803 m)   Wt 81.6 kg   SpO2 98%   BMI 25.10 kg/m   Physical Exam Vitals signs and nursing note reviewed.  Constitutional:      Appearance: He is well-developed.  HENT:     Head: Normocephalic and atraumatic.  Eyes:     Conjunctiva/sclera: Conjunctivae normal.  Neck:     Musculoskeletal: Normal range of motion and neck supple.  Pulmonary:     Effort: No respiratory distress.  Skin:    General: Skin is warm and dry.  Neurological:     Mental Status: He is alert.      ED Treatments / Results  Labs (all labs ordered are listed, but only abnormal results are displayed) Labs Reviewed  URINALYSIS, ROUTINE W REFLEX MICROSCOPIC - Abnormal; Notable for the following components:      Result Value   Color, Urine STRAW (*)    All other components within normal limits  GC/CHLAMYDIA PROBE AMP ([place]) NOT AT [facility]    EKG None  Radiology No results found.  Procedures Procedures (including critical care time)  Medications Ordered in ED Medications  cefTRIAXone (ROCEPHIN) injection 250 mg (has no administration in time range)  azithromycin (ZITHROMAX) tablet 1,000 mg (has no administration in time range)     Initial Impression / Assessment and Plan / ED Course  I have reviewed the triage vital signs and the nursing notes.  Pertinent labs & imaging results that were available during my care of the patient were reviewed by me and considered in my medical decision making (see chart for details).        Patient seen and examined.  UA reviewed, normal.  Will treat with Rocephin and  azithromycin.  Vital signs reviewed and are as follows: BP (!) 134/91   Pulse 84   Temp 98.8 F (37.1 C) (Oral)   Resp 20   Ht 5\' 11"  (1.803 m)   Wt 81.6 kg   SpO2 98%   BMI 25.10 kg/m   Will test and treat for STD exposure. Patient counseled on safe sexual practices. Told them that they should not have sexual contact for next 7 days and that they need to inform sexual partners so that they can get tested and treated as well. Patient verbalizes understanding and agrees with plan.    Requested RX albuterol due to recent asthma flare. No sx now. Rx sent.   Final Clinical Impressions(s) / ED Diagnoses   Final diagnoses:  STD exposure   STD exposure, asymptomatic.  Patient counseled.  ED Discharge Orders    None       Carlisle Cater, PA-C 04/03/19 2343    Carlisle Cater, PA-C 04/03/19 [place], Lyman, DO 04/04/19 0321

## 2019-04-07 LAB — GC/CHLAMYDIA PROBE AMP (~~LOC~~) NOT AT ARMC
Chlamydia: NEGATIVE
Neisseria Gonorrhea: NEGATIVE

## 2019-04-08 ENCOUNTER — Other Ambulatory Visit: Payer: Self-pay

## 2019-04-08 ENCOUNTER — Emergency Department (HOSPITAL_BASED_OUTPATIENT_CLINIC_OR_DEPARTMENT_OTHER)
Admission: EM | Admit: 2019-04-08 | Discharge: 2019-04-09 | Disposition: A | Discharge status: Home / Self Care | Payer: BC Managed Care – PPO | Attending: Emergency Medicine | Admitting: Emergency Medicine

## 2019-04-08 ENCOUNTER — Encounter (HOSPITAL_BASED_OUTPATIENT_CLINIC_OR_DEPARTMENT_OTHER): Payer: Self-pay | Admitting: *Deleted

## 2019-04-08 DIAGNOSIS — F17210 Nicotine dependence, cigarettes, uncomplicated: Secondary | ICD-10-CM | POA: Diagnosis not present

## 2019-04-08 DIAGNOSIS — H1031 Unspecified acute conjunctivitis, right eye: Secondary | ICD-10-CM | POA: Diagnosis not present

## 2019-04-08 DIAGNOSIS — H5789 Other specified disorders of eye and adnexa: Secondary | ICD-10-CM | POA: Diagnosis present

## 2019-04-08 DIAGNOSIS — J45909 Unspecified asthma, uncomplicated: Secondary | ICD-10-CM | POA: Diagnosis not present

## 2019-04-08 NOTE — ED Triage Notes (Signed)
Pt c/o right eye drainage and pain x 3 days

## 2019-04-09 MED ORDER — CIPROFLOXACIN HCL 0.3 % OP SOLN
1.0000 [drp] | OPHTHALMIC | Status: DC
  Administered 2019-04-09: 2 [drp] via OPHTHALMIC
  Filled 2019-04-09: qty 2.5

## 2019-04-09 MED ORDER — TETRACAINE HCL 0.5 % OP SOLN
2.0000 [drp] | Freq: Once | OPHTHALMIC | Status: AC
  Administered 2019-04-09: 2 [drp] via OPHTHALMIC
  Filled 2019-04-09: qty 4

## 2019-04-09 MED ORDER — FLUORESCEIN SODIUM 1 MG OP STRP
1.0000 | ORAL_STRIP | Freq: Once | OPHTHALMIC | Status: AC
  Administered 2019-04-09: 1 via OPHTHALMIC
  Filled 2019-04-09: qty 1

## 2019-04-09 NOTE — ED Provider Notes (Signed)
[facility] Provider Note: Lowella Dell, MD, FACEP  CSN: [id number] MRN: [id number] ARRIVAL: 04/08/19 at 2112 ROOM: MHFT1/[facility]FT1   CHIEF COMPLAINT  Eye Problem   HISTORY OF PRESENT ILLNESS  04/09/19 12:11 AM Lance Ball is a 33 y.o. male with a 3-day history of right eye redness, irritation, drainage and crusting.  He rates associated pain is a 9 out of 10.  Vision is somewhat blurred in the right eye.  Symptoms began with this foreign body sensation and he flushed his eye with water. Symptoms have subsequently progressed.    Past Medical History:  Diagnosis Date  . Asthma     Past Surgical History:  Procedure Laterality Date  . NO PAST SURGERIES      Family History  Family history unknown: Yes    Social History   Tobacco Use  . Smoking status: Current Every Day Smoker    Packs/day: 0.50    Types: Cigarettes  . Smokeless tobacco: Never Used  . Tobacco comment: 2-3 cigarettes/week  Substance Use Topics  . Alcohol use: Yes    Alcohol/week: 2.0 standard drinks    Types: 2 Standard drinks or equivalent per week    Comment: social  . Drug use: Yes    Types: Marijuana    Prior to Admission medications   Medication Sig Start Date End Date Taking? Authorizing Provider  albuterol (VENTOLIN HFA) 108 (90 Base) MCG/ACT inhaler Inhale 1-2 puffs into the lungs every 4 (four) hours as needed for wheezing or shortness of breath. 04/03/19   Renne Crigler, PA-C  predniSONE (DELTASONE) 20 MG tablet Take 2 tablets (40 mg total) by mouth daily. 02/13/19   Gwyneth Sprout, MD    Allergies Patient has no known allergies.   REVIEW OF SYSTEMS  Negative except as noted here or in the History of Present Illness.   PHYSICAL EXAMINATION  Initial Vital Signs Blood pressure (!) 139/92, pulse 97, temperature 98.5 F (36.9 C), resp. rate 16, height 5\' 10"  (1.778 m), weight 81 kg.  Examination General: Well-developed, well-nourished male in no acute distress;  appearance consistent with age of record HENT: normocephalic; atraumatic Eyes: pupils equal, round and reactive to light; extraocular muscles intact; right photophobia; right conjunctival injection; no corneal abrasion or dendritic pattern seen on fluorescein instillation; no hyphema; no hypopyon; globes soft Neck: supple Heart: regular rate and rhythm Lungs: clear to auscultation bilaterally Abdomen: soft; nondistended; nontender; bowel sounds present Extremities: No deformity; full range of motion Neurologic: Awake, alert and oriented; motor function intact in all extremities and symmetric; no facial droop Skin: Warm and dry Psychiatric: Normal mood and affect   RESULTS  Summary of this visit's results, reviewed and interpreted by myself:   EKG Interpretation  Date/Time:    Ventricular Rate:    PR Interval:    QRS Duration:   QT Interval:    QTC Calculation:   R Axis:     Text Interpretation:        Laboratory Studies: No results found for this or any previous visit (from the past 24 hour(s)). Imaging Studies: No results found.  ED COURSE and MDM  Nursing notes, initial and subsequent vitals signs, including pulse oximetry, reviewed and interpreted by myself.  Vitals:   04/08/19 2120 04/08/19 2122 04/09/19 0027  BP:  (!) 139/92 (!) 136/98  Pulse:  97 71  Resp:  16 20  Temp:  98.5 F (36.9 C) 97.7 F (36.5 C)  TempSrc:   Oral  SpO2:  98%  Weight: 81 kg    Height: 5\' 10"  (1.778 m)     Medications  ciprofloxacin (CILOXAN) 0.3 % ophthalmic solution 1-2 drop (has no administration in time range)  fluorescein ophthalmic strip 1 strip (1 strip Right Eye Given 04/09/19 0018)  tetracaine (PONTOCAINE) 0.5 % ophthalmic solution 2 drop (2 drops Right Eye Given 04/09/19 0018)    We will start patient on ciprofloxacin ophthalmic drops for likely conjunctivitis and refer to ophthalmology for reevaluation later today.  PROCEDURES  Procedures   ED DIAGNOSES      ICD-10-CM   1. Acute conjunctivitis of right eye, unspecified acute conjunctivitis type  H10.31        Cartier Mapel, Jenny Reichmann, MD 04/09/19 [phone]

## 2020-05-03 ENCOUNTER — Other Ambulatory Visit: Payer: Self-pay

## 2020-05-03 ENCOUNTER — Encounter (HOSPITAL_BASED_OUTPATIENT_CLINIC_OR_DEPARTMENT_OTHER): Payer: Self-pay | Admitting: Emergency Medicine

## 2020-05-03 ENCOUNTER — Emergency Department (HOSPITAL_BASED_OUTPATIENT_CLINIC_OR_DEPARTMENT_OTHER)
Admission: EM | Admit: 2020-05-03 | Discharge: 2020-05-03 | Disposition: A | Discharge status: Home / Self Care | Payer: BC Managed Care – PPO | Attending: Emergency Medicine | Admitting: Emergency Medicine

## 2020-05-03 DIAGNOSIS — J45909 Unspecified asthma, uncomplicated: Secondary | ICD-10-CM | POA: Diagnosis not present

## 2020-05-03 DIAGNOSIS — Z87891 Personal history of nicotine dependence: Secondary | ICD-10-CM | POA: Diagnosis not present

## 2020-05-03 DIAGNOSIS — H9202 Otalgia, left ear: Secondary | ICD-10-CM | POA: Diagnosis present

## 2020-05-03 DIAGNOSIS — H60312 Diffuse otitis externa, left ear: Secondary | ICD-10-CM | POA: Insufficient documentation

## 2020-05-03 MED ORDER — CEPHALEXIN 500 MG PO CAPS: 500.0000 mg | ORAL_CAPSULE | Freq: Three times a day (TID) | ORAL | 0 refills | Status: DC

## 2020-05-03 MED ORDER — NEOMYCIN-POLYMYXIN-HC 3.5-10000-1 OT SUSP: 3.0000 [drp] | Freq: Three times a day (TID) | OTIC | 0 refills | Status: AC

## 2020-05-03 MED ORDER — HYDROCODONE-ACETAMINOPHEN 5-325 MG PO TABS: 1.0000 | ORAL_TABLET | ORAL | 0 refills | Status: AC | PRN

## 2020-05-03 MED ORDER — CEPHALEXIN 250 MG PO CAPS
500.0000 mg | ORAL_CAPSULE | Freq: Once | ORAL | Status: AC
  Administered 2020-05-03: 500 mg via ORAL
  Filled 2020-05-03: qty 2

## 2020-05-03 MED ORDER — IBUPROFEN 400 MG PO TABS
400.0000 mg | ORAL_TABLET | Freq: Once | ORAL | Status: AC
  Administered 2020-05-03: 400 mg via ORAL
  Filled 2020-05-03: qty 1

## 2020-05-03 NOTE — ED Provider Notes (Signed)
[facility] EMERGENCY DEPARTMENT Provider Note   CSN: [id number] Arrival date & time: 05/03/20  0423   History Chief Complaint  Patient presents with   Otalgia    Lance Ball is a 34 y.o. male.  The history is provided by the patient.  Otalgia He has history of asthma and comes in complaining of pain in his left ear which started yesterday and has gotten worse.  Pain is rated at 10/10.  There is no difficulty hearing.  He denies fever or chills.  Pain does radiate into his neck and he feels that there is some swelling around the ear.  He denies rhinorrhea, sore throat, cough.  He took a dose of acetaminophen which gave little relief.  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    Past Surgical History:  Procedure Laterality Date   NO PAST SURGERIES         Family History  Family history unknown: Yes    Social History   Tobacco Use   Smoking status: Former Smoker    Packs/day: 0.50    Types: Cigarettes   Smokeless tobacco: Never Used   Tobacco comment: 2-3 cigarettes/week  Vaping Use   Vaping Use: Never used  Substance Use Topics   Alcohol use: Yes    Alcohol/week: 2.0 standard drinks    Types: 2 Standard drinks or equivalent per week    Comment: social   Drug use: Yes    Types: Marijuana    Home Medications Prior to Admission medications   Medication Sig Start Date End Date Taking? Authorizing Provider  albuterol (VENTOLIN HFA) 108 (90 Base) MCG/ACT inhaler Inhale 1-2 puffs into the lungs every 4 (four) hours as needed for wheezing or shortness of breath. 04/03/19   Renne Crigler, PA-C  cephALEXin (KEFLEX) 500 MG capsule Take 1 capsule (500 mg total) by mouth 3 (three) times daily. 05/03/20   Dione Booze, MD  HYDROcodone-acetaminophen (NORCO) 5-325 MG tablet Take 1 tablet by mouth every 4 (four) hours as needed for moderate pain.  05/03/20   Dione Booze, MD  neomycin-polymyxin-hydrocortisone (CORTISPORIN) 3.5-10000-1 OTIC suspension Place 3 drops into the left ear 3 (three) times daily. X 7 days 05/03/20   Dione Booze, MD    Allergies    Patient has no known allergies.  Review of Systems   Review of Systems  HENT: Positive for ear pain.   All other systems reviewed and are negative.   Physical Exam Updated Vital Signs BP (!) 146/97 (BP Location: Left Arm)    Pulse 88    Temp 98.8 F (37.1 C) (Oral)    Resp 16    Ht 6' (1.829 m)    Wt 83.9 kg    SpO2 97%    BMI 25.09 kg/m   Physical Exam Vitals and nursing note reviewed.   34 year old male, resting comfortably and in no acute distress. Vital signs are significant for mildly elevated blood pressure. Oxygen saturation is 97%, which is normal. Head is normocephalic and atraumatic. PERRLA, EOMI. Oropharynx is clear.  Right tabetic membrane is clear.  Left external auditory canal is erythematous and swollen.  Pain is elicited when tension is applied to the helix.  Left tympanic membrane is normal.  There is a tender left preauricular lymph node palpable. Neck is nontender and supple without adenopathy or JVD. Back is nontender and there  is no CVA tenderness. Lungs are clear without rales, wheezes, or rhonchi. Chest is nontender. Heart has regular rate and rhythm without murmur. Abdomen is soft, flat, nontender without masses or hepatosplenomegaly and peristalsis is normoactive. Extremities have no cyanosis or edema, full range of motion is present. Skin is warm and dry without rash. Neurologic: Mental status is normal, cranial nerves are intact, there are no motor or sensory deficits.  ED Results / Procedures / Treatments    Procedures Procedures   Medications Ordered in ED Medications  cephALEXin (KEFLEX) capsule 500 mg (has no administration in time range)  ibuprofen (ADVIL) tablet 400 mg (has no administration in time range)    ED Course  I have  reviewed the triage vital signs and the nursing notes.  MDM Rules/Calculators/A&P Left otitis media.  Old records are reviewed, and he has no relevant past visits.  He is discharged with prescriptions for cephalexin and Cortisporin otic.  Also given a prescription for small number of hydrocodone-acetaminophen tablets, but encouraged to use ibuprofen and/or acetaminophen and reserve hydrocodone for severe pain.  Follow-up with ENT if not improving.  Final Clinical Impression(s) / ED Diagnoses Final diagnoses:  Acute diffuse otitis externa of left ear    Rx / DC Orders ED Discharge Orders         Ordered    cephALEXin (KEFLEX) 500 MG capsule  3 times daily        05/03/20 0618    neomycin-polymyxin-hydrocortisone (CORTISPORIN) 3.5-10000-1 OTIC suspension  3 times daily        05/03/20 0618    HYDROcodone-acetaminophen (NORCO) 5-325 MG tablet  Every 4 hours PRN        05/03/20 9622           Dione Booze, MD 05/03/20 [id number]

## 2020-05-03 NOTE — ED Triage Notes (Signed)
Pt is c/o left ear pain   Pt states the pain radiates into his neck  Pt states he took a tylenol pm around 2200  Pt states pain started yesterday

## 2020-10-19 IMAGING — CR DG CHEST 2V
2 series · 2 of 2 positions shown · non-contrast
Comparison: Chest radiograph 07/04/2011

CLINICAL DATA: Pt having cough and congestion for a few days,hx of
asthma

EXAM:
CHEST - 2 VIEW

[w chest pa]
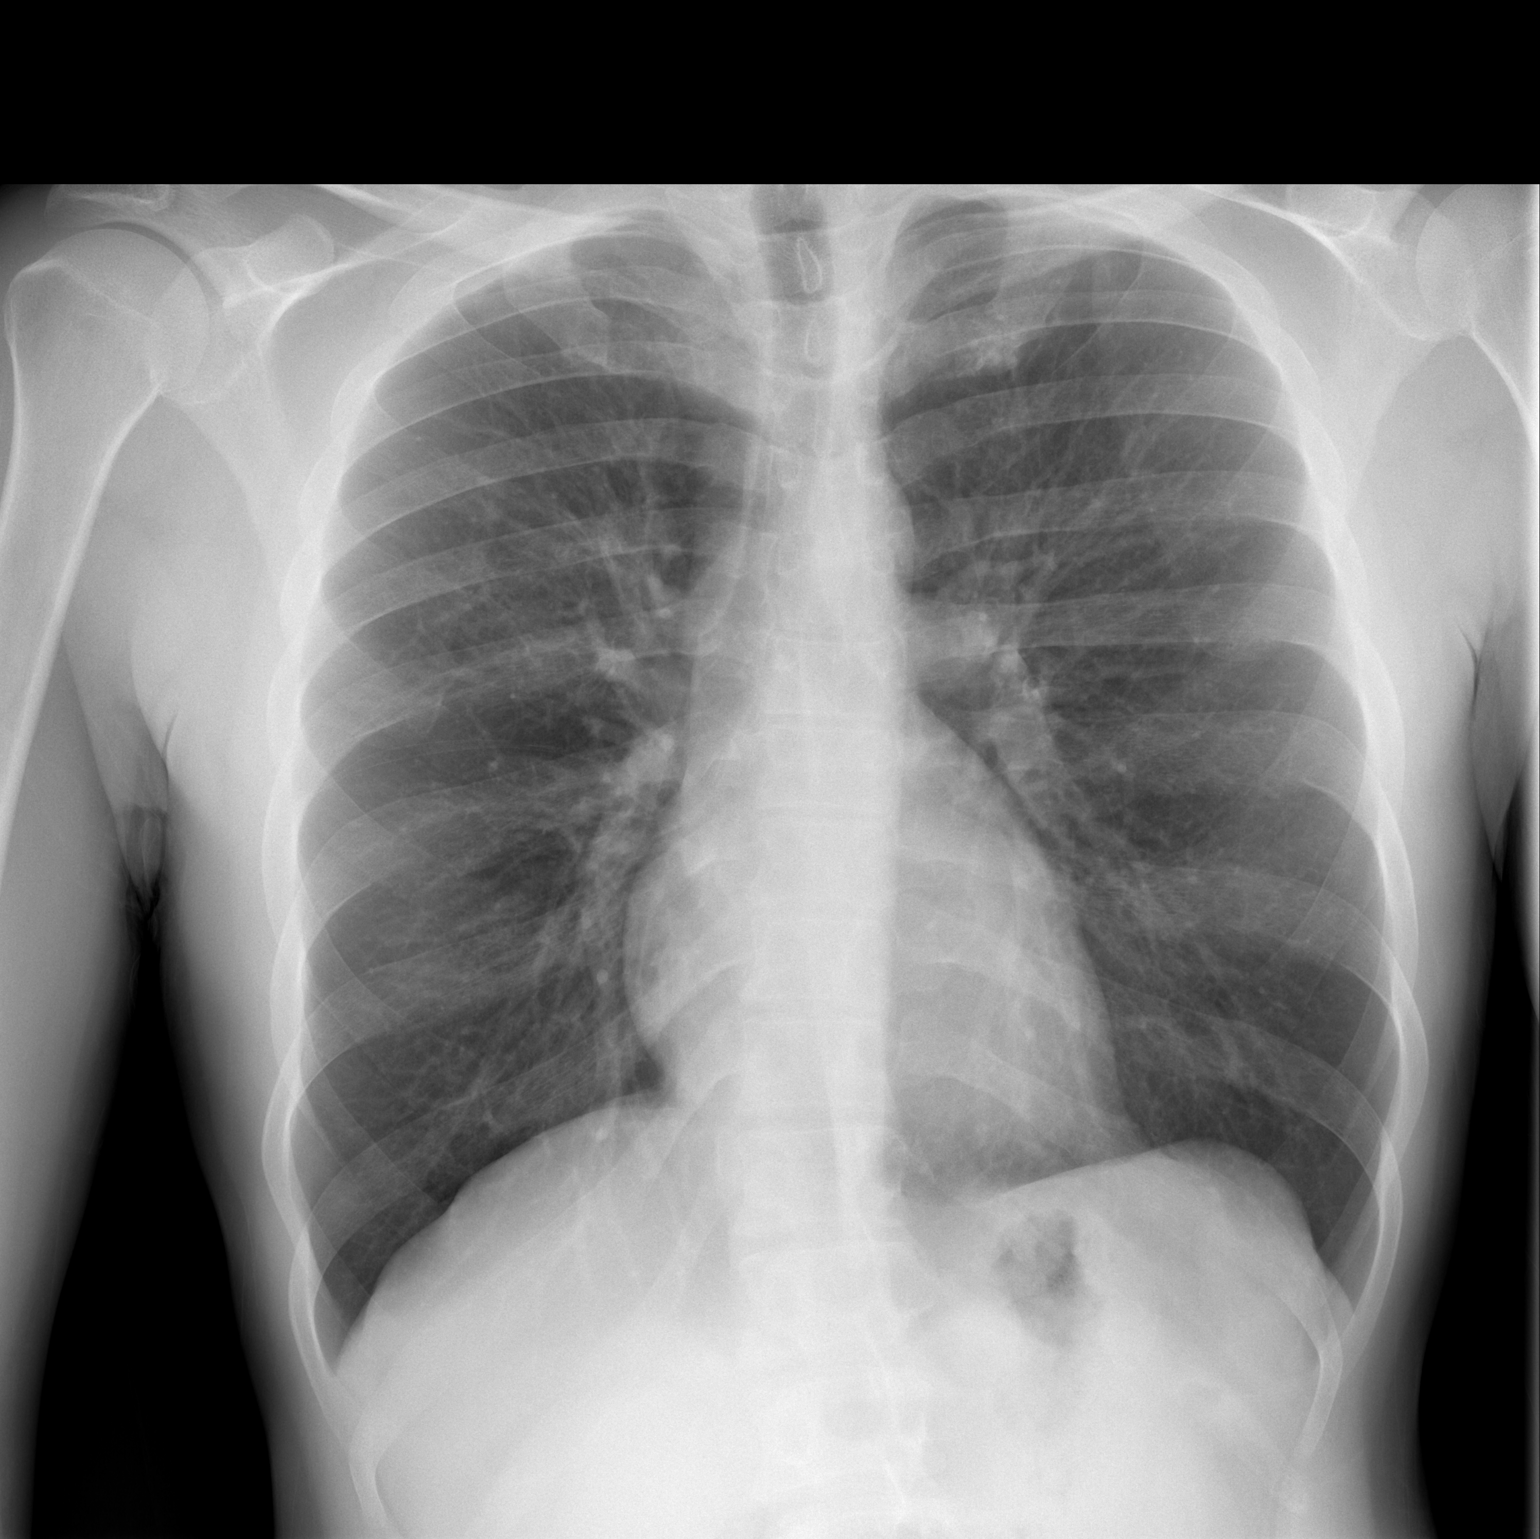

[w chest lat]
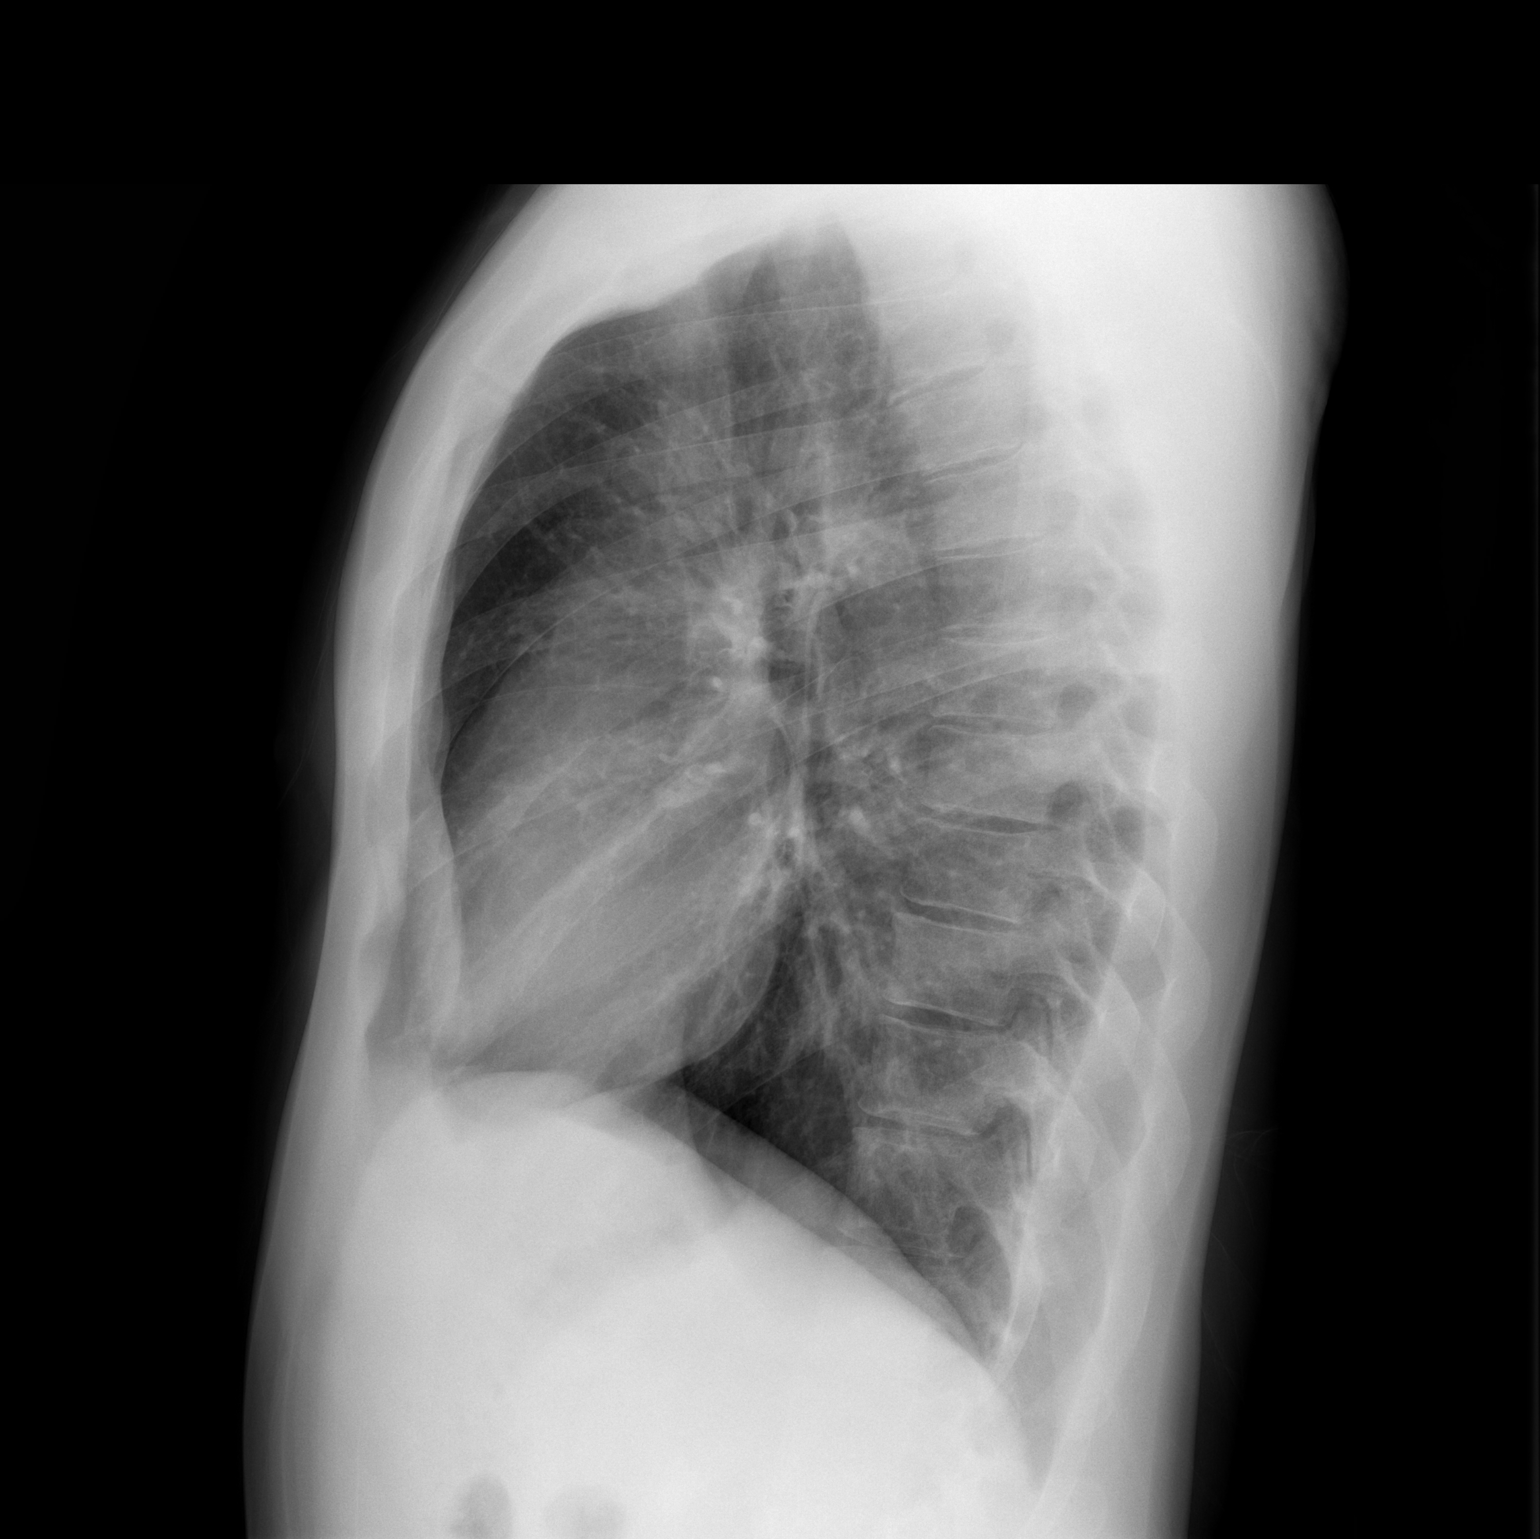

[2 of 2 positions shown; findings below may reference images not displayed]

FINDINGS: The heart size and mediastinal contours are within normal limits.
The lungs are clear. No pneumothorax or pleural effusion. The
visualized skeletal structures are unremarkable.
IMPRESSION: No active cardiopulmonary disease.

## 2022-03-03 ENCOUNTER — Emergency Department (HOSPITAL_BASED_OUTPATIENT_CLINIC_OR_DEPARTMENT_OTHER)
Admission: EM | Admit: 2022-03-03 | Discharge: 2022-03-03 | Disposition: A | Discharge status: Home / Self Care | Payer: No Typology Code available for payment source | Attending: Emergency Medicine | Admitting: Emergency Medicine

## 2022-03-03 ENCOUNTER — Other Ambulatory Visit: Payer: Self-pay

## 2022-03-03 ENCOUNTER — Encounter (HOSPITAL_BASED_OUTPATIENT_CLINIC_OR_DEPARTMENT_OTHER): Payer: Self-pay

## 2022-03-03 ENCOUNTER — Emergency Department (HOSPITAL_BASED_OUTPATIENT_CLINIC_OR_DEPARTMENT_OTHER): Payer: No Typology Code available for payment source

## 2022-03-03 DIAGNOSIS — Y92410 Unspecified street and highway as the place of occurrence of the external cause: Secondary | ICD-10-CM | POA: Insufficient documentation

## 2022-03-03 DIAGNOSIS — M25512 Pain in left shoulder: Secondary | ICD-10-CM | POA: Insufficient documentation

## 2022-03-03 MED ORDER — CYCLOBENZAPRINE HCL 10 MG PO TABS: 10.0000 mg | ORAL_TABLET | Freq: Two times a day (BID) | ORAL | 0 refills | Status: DC | PRN

## 2022-03-03 NOTE — Discharge Instructions (Signed)
You have been seen here for shoulder. I recommend taking over-the-counter pain medications like ibuprofen and/or Tylenol every 6 as needed.  Please follow dosage and on the back of bottle.  I also recommend applying heat to the area and stretching out the muscles as this will help decrease stiffness and pain.  I have given you information on exercises please follow.  I have given you a muscle relaxer this medication make you drowsy do not consume alcohol operate every machinery/drive while taking this medication.  Follow-up with your PCP if symptoms not improving after a week's time.  Come back to the emergency department if you develop chest pain, shortness of breath, severe abdominal pain, uncontrolled nausea, vomiting, diarrhea.
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
## 2022-03-20 ENCOUNTER — Emergency Department (HOSPITAL_BASED_OUTPATIENT_CLINIC_OR_DEPARTMENT_OTHER)
Admission: EM | Admit: 2022-03-20 | Discharge: 2022-03-20 | Disposition: A | Discharge status: Home / Self Care | Payer: Self-pay | Attending: Emergency Medicine | Admitting: Emergency Medicine

## 2022-03-20 ENCOUNTER — Encounter (HOSPITAL_BASED_OUTPATIENT_CLINIC_OR_DEPARTMENT_OTHER): Payer: Self-pay | Admitting: Emergency Medicine

## 2022-03-20 ENCOUNTER — Other Ambulatory Visit: Payer: Self-pay

## 2022-03-20 DIAGNOSIS — Z23 Encounter for immunization: Secondary | ICD-10-CM | POA: Insufficient documentation

## 2022-03-20 DIAGNOSIS — W5581XA Bitten by other mammals, initial encounter: Secondary | ICD-10-CM | POA: Insufficient documentation

## 2022-03-20 DIAGNOSIS — S91132A Puncture wound without foreign body of left great toe without damage to nail, initial encounter: Secondary | ICD-10-CM | POA: Insufficient documentation

## 2022-03-20 MED ORDER — AMOXICILLIN-POT CLAVULANATE 875-125 MG PO TABS: 1.0000 | ORAL_TABLET | Freq: Two times a day (BID) | ORAL | 0 refills | Status: DC

## 2022-03-20 MED ORDER — AMOXICILLIN-POT CLAVULANATE 875-125 MG PO TABS: 1.0000 | ORAL_TABLET | Freq: Two times a day (BID) | ORAL | 0 refills | Status: AC

## 2022-03-20 MED ORDER — TETANUS-DIPHTH-ACELL PERTUSSIS 5-2.5-18.5 LF-MCG/0.5 IM SUSY
0.5000 mL | PREFILLED_SYRINGE | Freq: Once | INTRAMUSCULAR | Status: AC
  Administered 2022-03-20: 0.5 mL via INTRAMUSCULAR
  Filled 2022-03-20: qty 0.5

## 2022-03-20 NOTE — ED Triage Notes (Signed)
Reports rat bite to left big toe this AM , witnessed the rat on his bed ,

## 2022-03-20 NOTE — ED Provider Notes (Signed)
[facility] Provider Note   CSN: [id number] Arrival date & time: 03/20/22  1040     History  Chief Complaint  Patient presents with   Animal Bite    [facility] is a 36 y.o. male.   Animal Bite Patient is a 36 year old male denies any past medical history presenting to the emergency room today with complaint of rat bite to his left great toe this morning.  He states that he saw the rat on his bed and had his left foot out underneath the covers.  He states he was bit once woke up jumped out of bed and the right immediately scampered away.  He denies any other injuries or bites.  Is uncertain of his last tetanus vaccine     Home Medications Prior to Admission medications   Medication Sig Start Date End Date Taking? Authorizing Provider  amoxicillin-clavulanate (AUGMENTIN) 875-125 MG tablet Take 1 tablet by mouth every 12 (twelve) hours. 03/20/22  Yes Lamir Racca S, PA  albuterol (VENTOLIN HFA) 108 (90 Base) MCG/ACT inhaler Inhale 1-2 puffs into the lungs every 4 (four) hours as needed for wheezing or shortness of breath. 04/03/19   Carlisle Cater, PA-C  cyclobenzaprine (FLEXERIL) 10 MG tablet Take 1 tablet (10 mg total) by mouth 2 (two) times daily as needed for muscle spasms. 03/03/22   Marcello Fennel, PA-C  HYDROcodone-acetaminophen (NORCO) 5-325 MG tablet Take 1 tablet by mouth every 4 (four) hours as needed for moderate pain. 16/96/78   Delora Fuel, MD  neomycin-polymyxin-hydrocortisone (CORTISPORIN) 3.5-10000-1 OTIC suspension Place 3 drops into the left ear 3 (three) times daily. X 7 days 93/81/01   Delora Fuel, MD      Allergies    Patient has no known allergies.    Review of Systems   Review of Systems  Physical Exam Updated Vital Signs BP (!) 140/87   Pulse (!) 58   Temp 98.2 F (36.8 C) (Oral)   Resp 14   Ht 5\' 11"  (1.803 m)   Wt 82.1 kg   SpO2 100%   BMI 25.24 kg/m  Physical Exam Vitals and nursing note  reviewed.  Constitutional:      General: He is not in acute distress.    Appearance: Normal appearance. He is not ill-appearing.  HENT:     Head: Normocephalic and atraumatic.     Mouth/Throat:     Mouth: Mucous membranes are moist.  Eyes:     General: No scleral icterus.       Right eye: No discharge.        Left eye: No discharge.     Conjunctiva/sclera: Conjunctivae normal.  Pulmonary:     Effort: Pulmonary effort is normal.     Breath sounds: No stridor.  Skin:    General: Skin is warm and dry.     Comments: Small puncture wound to left great toe with no active bleeding.  Sensation intact distally, cap refill intact/less than 2 seconds.  There is no puncture wound overlying the joint.  This is at the tip of the left great toe.   Neurological:     Mental Status: He is alert and oriented to person, place, and time. Mental status is at baseline.     ED Results / Procedures / Treatments   Labs (all labs ordered are listed, but only abnormal results are displayed) Labs Reviewed - No data to display  EKG None  Radiology No results found.  Procedures Procedures  Medications Ordered in ED Medications  Tdap (BOOSTRIX) injection 0.5 mL (has no administration in time range)    ED Course/ Medical Decision Making/ A&P                           Medical Decision Making  Patient with rat bite to left great toe.  I do lengthy discussion with patient regarding neck steps in treatment.  He is uncertain of last tetanus vaccine we will update him on this.  We discussed the need for prophylactic antibiotics in the form of Augmentin.  He is agreeable with this plan.  We also discussed pros and cons of obtaining an x-ray of his toe to look for tooth fragments.  It is a somewhat superficial looking wound.  Is very small.  He states he would prefer not to have an x-ray done.  We discussed the risk of retained foreign body, ultimately will defer to his preference.  The below  up-to-date algorithm was used for decision for postexposure prophylaxis to rabies.  In this case no rabies PEP needed.  [email or web address]  Wound was cleansed by RN.  Wound care instructions were provided by me.  Patient recommended follow-up with PCP within 1 week for wound check.  Very strict return precautions were given.  Augmentin sent to pharmacy.  Return precautions discussed  Final Clinical Impression(s) / ED Diagnoses Final diagnoses:  Bite wound from mammal    Rx / DC Orders ED Discharge Orders          Ordered    amoxicillin-clavulanate (AUGMENTIN) 875-125 MG tablet  Every 12 hours        03/20/22 1137              [name], [place] 03/20/22 1140    Blanchie Dessert, MD 03/20/22 1442

## 2022-03-20 NOTE — ED Notes (Signed)
Reviewed discharge instructions , wound care and medication sent to pharmacy. Pt states understanding. Questions answered  Pt ambulatory for discharge

## 2022-03-20 NOTE — ED Notes (Signed)
2 open areas noted on left great toe. No active bleeding, swelling or redness noted

## 2022-03-20 NOTE — Discharge Instructions (Addendum)
Keep the area clean with warm soap and water dab dry and keep covered with a Band-Aid.  Your wound was cleaned here.  You can keep the bandage on for the next 12 hours.  After this you can remove the bandage and clean and rebandage.  Please use Tylenol or ibuprofen for pain.  You may use 600 mg ibuprofen every 6 hours or 1000 mg of Tylenol every 6 hours.  You may choose to alternate between the 2.  This would be most effective.  Not to exceed 4 g of Tylenol within 24 hours.  Not to exceed 3200 mg ibuprofen 24 hours.   Please take all of your antibiotics until finished!   You may develop abdominal discomfort or diarrhea from the antibiotic.  You may help offset this with probiotics which you can buy or get in yogurt. Do not eat  or take the probiotics until 2 hours after your antibiotic.     I recommend having your wound rechecked in the next 5 to 7 days.  Sooner if you develop any redness that streaks up the leg or any pus expressing from the wound.

## 2022-05-15 ENCOUNTER — Encounter (HOSPITAL_BASED_OUTPATIENT_CLINIC_OR_DEPARTMENT_OTHER): Payer: Self-pay | Admitting: Emergency Medicine

## 2022-05-15 ENCOUNTER — Emergency Department (HOSPITAL_BASED_OUTPATIENT_CLINIC_OR_DEPARTMENT_OTHER)
Admission: EM | Admit: 2022-05-15 | Discharge: 2022-05-15 | Disposition: A | Discharge status: Home / Self Care | Payer: Medicaid Other | Attending: Emergency Medicine | Admitting: Emergency Medicine

## 2022-05-15 ENCOUNTER — Other Ambulatory Visit: Payer: Self-pay

## 2022-05-15 DIAGNOSIS — R059 Cough, unspecified: Secondary | ICD-10-CM | POA: Diagnosis present

## 2022-05-15 DIAGNOSIS — J101 Influenza due to other identified influenza virus with other respiratory manifestations: Secondary | ICD-10-CM | POA: Insufficient documentation

## 2022-05-15 DIAGNOSIS — J111 Influenza due to unidentified influenza virus with other respiratory manifestations: Secondary | ICD-10-CM

## 2022-05-15 DIAGNOSIS — Z20822 Contact with and (suspected) exposure to covid-19: Secondary | ICD-10-CM | POA: Insufficient documentation

## 2022-05-15 LAB — RESP PANEL BY RT-PCR (RSV, FLU A&B, COVID)  RVPGX2
Influenza A by PCR: NEGATIVE
Influenza B by PCR: POSITIVE — AB
Resp Syncytial Virus by PCR: NEGATIVE
SARS Coronavirus 2 by RT PCR: NEGATIVE

## 2022-05-15 MED ORDER — ACETAMINOPHEN 325 MG PO TABS
650.0000 mg | ORAL_TABLET | Freq: Once | ORAL | Status: AC | PRN
  Administered 2022-05-15: 650 mg via ORAL
  Filled 2022-05-15: qty 2

## 2022-05-15 MED ORDER — IBUPROFEN 800 MG PO TABS
800.0000 mg | ORAL_TABLET | Freq: Once | ORAL | Status: AC
  Administered 2022-05-15: 800 mg via ORAL
  Filled 2022-05-15: qty 1

## 2022-05-15 NOTE — ED Notes (Signed)
ED Provider at bedside. 

## 2022-05-15 NOTE — ED Provider Notes (Signed)
[facility] EMERGENCY DEPARTMENT Provider Note   CSN: [id number] Arrival date & time: 05/15/22  2353     History {Add pertinent medical, surgical, social history, OB history to HPI:1} Chief Complaint  Patient presents with   Generalized Body Aches    [facility] is a 36 y.o. male.  36 year old male presents the emergency department with cough, myalgias, and fever.  Patient reports that on Friday started having myalgias and cough.  Says that he is also had some muscle aches in his lower left rib cage from coughing.  Says that he has tried Tylenol PM as well as cyclobenzaprine to help him sleep but has not been able to.  Also with congestion.  Did have recent contact with COVID.  Also says he has had mild shortness of breath.  Denies any chest pain at this time.       Home Medications Prior to Admission medications   Medication Sig Start Date End Date Taking? Authorizing Provider  albuterol (VENTOLIN HFA) 108 (90 Base) MCG/ACT inhaler Inhale 1-2 puffs into the lungs every 4 (four) hours as needed for wheezing or shortness of breath. 04/03/19   Renne Crigler, PA-C  amoxicillin-clavulanate (AUGMENTIN) 875-125 MG tablet Take 1 tablet by mouth every 12 (twelve) hours. 03/20/22   Gailen Shelter, PA  cyclobenzaprine (FLEXERIL) 10 MG tablet Take 1 tablet (10 mg total) by mouth 2 (two) times daily as needed for muscle spasms. 03/03/22   Carroll Sage, PA-C  HYDROcodone-acetaminophen (NORCO) 5-325 MG tablet Take 1 tablet by mouth every 4 (four) hours as needed for moderate pain. 05/03/20   Dione Booze, MD  neomycin-polymyxin-hydrocortisone (CORTISPORIN) 3.5-10000-1 OTIC suspension Place 3 drops into the left ear 3 (three) times daily. X 7 days 05/03/20   Dione Booze, MD      Allergies    Patient has no known allergies.    Review of Systems   Review of Systems  Physical Exam Updated Vital Signs BP (!) 133/92 (BP Location: Left Arm)   Pulse 85   Temp (!) 100.6 F  (38.1 C) (Oral)   Resp 20   Ht 5\' 11"  (1.803 m)   Wt 81.6 kg   SpO2 99%   BMI 25.10 kg/m  Physical Exam Vitals and nursing note reviewed.  Constitutional:      General: He is not in acute distress.    Appearance: He is well-developed.  HENT:     Head: Normocephalic and atraumatic.     Right Ear: External ear normal.     Left Ear: External ear normal.     Nose: Congestion present.  Eyes:     Extraocular Movements: Extraocular movements intact.     Conjunctiva/sclera: Conjunctivae normal.     Pupils: Pupils are equal, round, and reactive to light.  Cardiovascular:     Rate and Rhythm: Normal rate and regular rhythm.     Heart sounds: Normal heart sounds.  Pulmonary:     Effort: Pulmonary effort is normal. No respiratory distress.     Breath sounds: Normal breath sounds.  Musculoskeletal:     Cervical back: Normal range of motion and neck supple.     Right lower leg: No edema.     Left lower leg: No edema.  Skin:    General: Skin is warm and dry.  Neurological:     Mental Status: He is alert. Mental status is at baseline.  Psychiatric:        Mood and Affect: Mood normal.  Behavior: Behavior normal.     ED Results / Procedures / Treatments   Labs (all labs ordered are listed, but only abnormal results are displayed) Labs Reviewed  RESP PANEL BY RT-PCR (RSV, FLU A&B, COVID)  RVPGX2 - Abnormal; Notable for the following components:      Result Value   Influenza B by PCR POSITIVE (*)    All other components within normal limits    EKG None  Radiology No results found.  Procedures Procedures  {Document cardiac monitor, telemetry assessment procedure when appropriate:1}  Medications Ordered in ED Medications  acetaminophen (TYLENOL) tablet 650 mg (650 mg Oral Given 05/15/22 1552)    ED Course/ Medical Decision Making/ A&P                           Medical Decision Making Risk OTC drugs. Prescription drug management.   ***  {Document critical  care time when appropriate:1} {Document review of labs and clinical decision tools ie heart score, Chads2Vasc2 etc:1}  {Document your independent review of radiology images, and any outside records:1} {Document your discussion with family members, caretakers, and with consultants:1} {Document social determinants of health affecting pt's care:1} {Document your decision making why or why not admission, treatments were needed:1} Final Clinical Impression(s) / ED Diagnoses Final diagnoses:  None    Rx / DC Orders ED Discharge Orders     None

## 2022-05-15 NOTE — Discharge Instructions (Signed)
You were seen for your influenza in the emergency department.   At home, please take Tylenol and ibuprofen during the day for your fevers and muscle aches.  Take melatonin and Benadryl at night to help you sleep.    Follow-up with your primary doctor in 2-3 days regarding your visit or call the [place] primary care at [facility] for an appointment.  Return immediately to the emergency department if you experience any of the following: Difficulty breathing, chest pains, or any other concerning symptoms.    Thank you for visiting our Emergency Department. It was a pleasure taking care of you today.

## 2022-05-15 NOTE — ED Notes (Signed)
Given blanket °

## 2022-05-15 NOTE — ED Triage Notes (Signed)
Patient presents with multiple complaints. Cough, congestion, body aches, dizziness. COVID exposure

## 2022-05-16 ENCOUNTER — Encounter (HOSPITAL_BASED_OUTPATIENT_CLINIC_OR_DEPARTMENT_OTHER): Payer: Self-pay | Admitting: Emergency Medicine

## 2022-05-16 ENCOUNTER — Other Ambulatory Visit: Payer: Self-pay

## 2022-05-16 ENCOUNTER — Emergency Department (HOSPITAL_BASED_OUTPATIENT_CLINIC_OR_DEPARTMENT_OTHER)
Admission: EM | Admit: 2022-05-16 | Discharge: 2022-05-16 | Discharge status: Against Medical Advice | Payer: Medicaid Other | Attending: Emergency Medicine | Admitting: Emergency Medicine

## 2022-05-16 DIAGNOSIS — G43909 Migraine, unspecified, not intractable, without status migrainosus: Secondary | ICD-10-CM | POA: Diagnosis not present

## 2022-05-16 DIAGNOSIS — Z5321 Procedure and treatment not carried out due to patient leaving prior to being seen by health care provider: Secondary | ICD-10-CM | POA: Diagnosis not present

## 2022-05-16 DIAGNOSIS — J101 Influenza due to other identified influenza virus with other respiratory manifestations: Secondary | ICD-10-CM | POA: Diagnosis not present

## 2022-05-16 MED ORDER — IBUPROFEN 800 MG PO TABS
800.0000 mg | ORAL_TABLET | Freq: Once | ORAL | Status: AC
  Administered 2022-05-16: 800 mg via ORAL
  Filled 2022-05-16: qty 1

## 2022-05-16 NOTE — ED Triage Notes (Addendum)
Pt seen yesterday for same , tested positive for flu states cannot sleept, has been taking meds excedrin  last med 1 hour ago states has a migrain also

## 2022-05-16 NOTE — ED Triage Notes (Signed)
Called to take to treatment room  No response from lobby 

## 2022-08-17 ENCOUNTER — Encounter (HOSPITAL_BASED_OUTPATIENT_CLINIC_OR_DEPARTMENT_OTHER): Payer: Self-pay

## 2022-08-17 ENCOUNTER — Emergency Department (HOSPITAL_BASED_OUTPATIENT_CLINIC_OR_DEPARTMENT_OTHER): Payer: Medicaid Other

## 2022-08-17 ENCOUNTER — Other Ambulatory Visit: Payer: Self-pay

## 2022-08-17 ENCOUNTER — Emergency Department (HOSPITAL_BASED_OUTPATIENT_CLINIC_OR_DEPARTMENT_OTHER)
Admission: EM | Admit: 2022-08-17 | Discharge: 2022-08-17 | Disposition: A | Discharge status: Home / Self Care | Payer: Medicaid Other | Attending: Emergency Medicine | Admitting: Emergency Medicine

## 2022-08-17 DIAGNOSIS — M25512 Pain in left shoulder: Secondary | ICD-10-CM | POA: Insufficient documentation

## 2022-08-17 DIAGNOSIS — E871 Hypo-osmolality and hyponatremia: Secondary | ICD-10-CM | POA: Diagnosis not present

## 2022-08-17 DIAGNOSIS — Y990 Civilian activity done for income or pay: Secondary | ICD-10-CM | POA: Diagnosis not present

## 2022-08-17 DIAGNOSIS — S29012A Strain of muscle and tendon of back wall of thorax, initial encounter: Secondary | ICD-10-CM | POA: Insufficient documentation

## 2022-08-17 DIAGNOSIS — X500XXA Overexertion from strenuous movement or load, initial encounter: Secondary | ICD-10-CM | POA: Insufficient documentation

## 2022-08-17 DIAGNOSIS — M546 Pain in thoracic spine: Secondary | ICD-10-CM | POA: Diagnosis present

## 2022-08-17 DIAGNOSIS — M791 Myalgia, unspecified site: Secondary | ICD-10-CM

## 2022-08-17 LAB — CBC WITH DIFFERENTIAL/PLATELET
Abs Immature Granulocytes: 0.02 10*3/uL (ref 0.00–0.07)
Basophils Absolute: 0.1 10*3/uL (ref 0.0–0.1)
Basophils Relative: 1 %
Eosinophils Absolute: 0.1 10*3/uL (ref 0.0–0.5)
Eosinophils Relative: 2 %
HCT: 37.5 % — ABNORMAL LOW (ref 39.0–52.0)
Hemoglobin: 13.9 g/dL (ref 13.0–17.0)
Immature Granulocytes: 0 %
Lymphocytes Relative: 21 %
Lymphs Abs: 1.6 10*3/uL (ref 0.7–4.0)
MCH: 29.8 pg (ref 26.0–34.0)
MCHC: 37.1 g/dL — ABNORMAL HIGH (ref 30.0–36.0)
MCV: 80.3 fL (ref 80.0–100.0)
Monocytes Absolute: 1 10*3/uL (ref 0.1–1.0)
Monocytes Relative: 14 %
Neutro Abs: 4.7 10*3/uL (ref 1.7–7.7)
Neutrophils Relative %: 62 %
Platelets: 382 10*3/uL (ref 150–400)
RBC: 4.67 MIL/uL (ref 4.22–5.81)
RDW: 13.2 % (ref 11.5–15.5)
WBC: 7.5 10*3/uL (ref 4.0–10.5)
nRBC: 0 % (ref 0.0–0.2)

## 2022-08-17 LAB — BASIC METABOLIC PANEL
Anion gap: 10 (ref 5–15)
BUN: 14 mg/dL (ref 6–20)
CO2: 21 mmol/L — ABNORMAL LOW (ref 22–32)
Calcium: 9.1 mg/dL (ref 8.9–10.3)
Chloride: 103 mmol/L (ref 98–111)
Creatinine, Ser: 1.17 mg/dL (ref 0.61–1.24)
GFR, Estimated: >60 mL/min (ref >60)
Glucose, Bld: 151 mg/dL — ABNORMAL HIGH (ref 70–99)
Potassium: 3.5 mmol/L (ref 3.5–5.1)
Sodium: 134 mmol/L — ABNORMAL LOW (ref 135–145)

## 2022-08-17 LAB — CK: Total CK: 238 U/L (ref 49–397)

## 2022-08-17 MED ORDER — LIDOCAINE 5 % EX PTCH: 1.0000 | MEDICATED_PATCH | CUTANEOUS | 0 refills | Status: AC

## 2022-08-17 MED ORDER — KETOROLAC TROMETHAMINE 30 MG/ML IJ SOLN
30.0000 mg | Freq: Once | INTRAMUSCULAR | Status: AC
  Administered 2022-08-17: 30 mg via INTRAVENOUS
  Filled 2022-08-17: qty 1

## 2022-08-17 MED ORDER — LIDOCAINE 5 % EX PTCH
1.0000 | MEDICATED_PATCH | CUTANEOUS | Status: DC
  Administered 2022-08-17: 1 via TRANSDERMAL
  Filled 2022-08-17: qty 1

## 2022-08-17 MED ORDER — METHOCARBAMOL 500 MG PO TABS: 500.0000 mg | ORAL_TABLET | Freq: Two times a day (BID) | ORAL | 0 refills | Status: DC | PRN

## 2022-08-17 MED ORDER — NAPROXEN 375 MG PO TABS: 375.0000 mg | ORAL_TABLET | Freq: Two times a day (BID) | ORAL | 0 refills | Status: DC

## 2022-08-17 MED ORDER — METHOCARBAMOL 500 MG PO TABS
500.0000 mg | ORAL_TABLET | Freq: Once | ORAL | Status: AC
  Administered 2022-08-17: 500 mg via ORAL
  Filled 2022-08-17: qty 1

## 2022-08-17 NOTE — ED Provider Notes (Signed)
[facility] Provider Note   CSN: [id number] Arrival date & time: 08/17/22  0848     History  Chief Complaint  Patient presents with   Muscle Pain    Pain in abdominal muscles, ribs, shoulders, neck, and back    [facility] is a 37 y.o. male.  With PMH of left shoulder labral tear and repair in May 2023 presenting with generalized upper body aches pain and left shoulder pain. Reports lifting and moving heavy materials often at work and working a lot recently.  Patient mainly reporting pain throughout his upper body and no pain in his legs or abdomen.  He has been having pain throughout his chest wall his left upper shoulder and upper back.  Worse with movement and palpation.  He does note lifting a significant amount at work recently.  He has had no fevers, no vomiting, no diarrhea, no cough, no shortness of breath, no skin rashes or changes.  He has been taking ibuprofen with some relief but without full relief.  He has had no recent injuries and has not been lifting weights.  No change in urination or color of urine.   Muscle Pain       Home Medications Prior to Admission medications   Medication Sig Start Date End Date Taking? Authorizing Provider  lidocaine (LIDODERM) 5 % Place 1 patch onto the skin daily. Remove & Discard patch within 12 hours or as directed by MD 08/17/22  Yes Elgie Congo, MD  methocarbamol (ROBAXIN) 500 MG tablet Take 1 tablet (500 mg total) by mouth 2 (two) times daily as needed for muscle spasms. 08/17/22  Yes Elgie Congo, MD  naproxen (NAPROSYN) 375 MG tablet Take 1 tablet (375 mg total) by mouth 2 (two) times daily. 08/17/22  Yes Elgie Congo, MD  albuterol (VENTOLIN HFA) 108 (90 Base) MCG/ACT inhaler Inhale 1-2 puffs into the lungs every 4 (four) hours as needed for wheezing or shortness of breath. 04/03/19   Carlisle Cater, PA-C  amoxicillin-clavulanate (AUGMENTIN) 875-125 MG tablet Take 1  tablet by mouth every 12 (twelve) hours. 03/20/22   Tedd Sias, PA  cyclobenzaprine (FLEXERIL) 10 MG tablet Take 1 tablet (10 mg total) by mouth 2 (two) times daily as needed for muscle spasms. 03/03/22   Marcello Fennel, PA-C  HYDROcodone-acetaminophen (NORCO) 5-325 MG tablet Take 1 tablet by mouth every 4 (four) hours as needed for moderate pain. [id number]   Delora Fuel, MD  neomycin-polymyxin-hydrocortisone (CORTISPORIN) 3.5-10000-1 OTIC suspension Place 3 drops into the left ear 3 (three) times daily. X 7 days [id number]   Delora Fuel, MD      Allergies    Patient has no known allergies.    Review of Systems   Review of Systems  Physical Exam Updated Vital Signs BP 130/82   Pulse 74   Temp 98.6 F (37 C) (Oral)   Resp 14   Ht 5\' 11"  (1.803 m)   Wt 81.6 kg   SpO2 100%   BMI 25.10 kg/m  Physical Exam Constitutional: Alert and oriented. Well appearing and in no distress. Eyes: Conjunctivae are normal. ENT      Head: Normocephalic and atraumatic. Cardiovascular: S1, S2,  Normal and symmetric distal pulses are present in all extremities.Warm and well perfused. Ttp diffusely throughout bilateral chest wall, no external skin changes, warm, erythema. Respiratory: Normal respiratory effort. Breath sounds are normal. O2 sat 99 on RA Gastrointestinal: Soft and nontender. Musculoskeletal: Normal  range of motion in all extremities. Tenderness to left scapular ridge and left anterior AC joint without erythema warm or skin changes.  Full range of motion of bilateral upper extremities intact with sensation grossly intact bilaterally and palpable bilateral equal radial pulses.  No deformities noted of bilateral upper extremities.      Right lower leg: No tenderness or edema.      Left lower leg: No tenderness or edema. Neurologic: Normal speech and language.  5 out of 5 strength bilateral upper and lower extremities.  Sensation grossly intact throughout extremities.  Steady gait.  No  gross focal neurologic deficits are appreciated. Skin: Skin is warm, dry and intact. No rash noted. Psychiatric: Mood and affect are normal. Speech and behavior are normal.  ED Results / Procedures / Treatments   Labs (all labs ordered are listed, but only abnormal results are displayed) Labs Reviewed  BASIC METABOLIC PANEL - Abnormal; Notable for the following components:      Result Value   Sodium 134 (*)    CO2 21 (*)    Glucose, Bld 151 (*)    All other components within normal limits  CBC WITH DIFFERENTIAL/PLATELET - Abnormal; Notable for the following components:   HCT 37.5 (*)    MCHC 37.1 (*)    All other components within normal limits  CK    EKG EKG Interpretation  Date/Time:  Friday August 17 2022 09:46:16 EDT Ventricular Rate:  63 PR Interval:  169 QRS Duration: 97 QT Interval:  409 QTC Calculation: 419 R Axis:   68 Text Interpretation: Sinus rhythm Abnormal R-wave progression, early transition No previous ECGs available Confirmed by Georgina Snell [phone]) on 08/17/2022 9:49:04 AM  Radiology DG Shoulder Left  Result Date: 08/17/2022 CLINICAL DATA:  Chest LEFT shoulder pain EXAM: LEFT SHOULDER - 2+ VIEW COMPARISON:  None Available. FINDINGS: Osseous mineralization normal. AC joint alignment normal. Visualized ribs intact. No fracture, dislocation, or bone destruction. IMPRESSION: No acute osseous abnormalities. Electronically Signed   By: Lavonia Dana M.D.   On: 08/17/2022 10:34   DG Chest 2 View  Result Date: 08/17/2022 CLINICAL DATA:  Chest and LEFT shoulder pain, RIGHT neck pain EXAM: CHEST - 2 VIEW COMPARISON:  02/13/2019 FINDINGS: Normal heart size, mediastinal contours, and pulmonary vascularity. Mild peribronchial thickening. No acute infiltrate, pleural effusion, or pneumothorax. Osseous structures unremarkable. IMPRESSION: Mild bronchitic changes without infiltrate. Electronically Signed   By: Lavonia Dana M.D.   On: 08/17/2022 10:33     Procedures Procedures    Medications Ordered in ED Medications  ketorolac (TORADOL) 30 MG/ML injection 30 mg (30 mg Intravenous Given 08/17/22 1002)  methocarbamol (ROBAXIN) tablet 500 mg (500 mg Oral Given 08/17/22 1002)    ED Course/ Medical Decision Making/ A&P    Medical Decision Making [facility] is a 37 y.o. male.  With PMH of left shoulder labral tear and repair in May 2023 presenting with generalized upper body aches pain and left shoulder pain. Reports lifting and moving heavy materials often at work and working a lot recently.    Patient with myalgias and localized pain to the left shoulder and left thoracic region in the setting of overuse, suspect most consistent with nonspecific muscular strain and associated myalgias.  He is neurovascularly intact there is no concern for ischemic limb.  There are no rashes present.  No pain, no erythema, no warmth, no edema, no pain with passive range of motion, no concern for septic joint.  EKG NSR with no  acute ST/T changes, chest wall pain reproducible, I am not concerned for ACS. He had a chest x-ray and left shoulder x-ray which I personally reviewed showing no fractures or dislocation and no pneumonia, no pneumothorax.  Labs were obtained to ensure no acute electrolyte abnormalities.  His K is 3.5 within normal limits.  Mild hyponatremia 134.  Normal white blood cell count 7.5 and no left shift.  Normal hemoglobin. CK wnl, no rhabdo.  Advised patient to continue supportive care with naproxen, Lidoderm patches and Robaxin.  Advised continued gentle exercises.  Advise follow-up with PCP.  Return precautions discussed.  Discharged in good condition.  Amount and/or Complexity of Data Reviewed Labs: ordered. Radiology: ordered.  Risk Prescription drug management.    Final Clinical Impression(s) / ED Diagnoses Final diagnoses:  Myalgia  Strain of thoracic back region  Left shoulder pain, unspecified chronicity    Rx / DC  Orders ED Discharge Orders          Ordered    naproxen (NAPROSYN) 375 MG tablet  2 times daily        08/17/22 1041    lidocaine (LIDODERM) 5 %  Every 24 hours        08/17/22 1041    methocarbamol (ROBAXIN) 500 MG tablet  2 times daily PRN        08/17/22 1041              Elgie Congo, MD 08/17/22 2034

## 2022-08-17 NOTE — ED Triage Notes (Signed)
Pt reports muscular pain in front and back from waist to shoulders since Tuesday. "Sore to touch" Surgery on L shoulder. Pain and swelling in shoulder past couple of days.Reports Right side of neck pain.    Heavy lifting occupation  Tried hot showers, stretching, taken ibuprofen

## 2022-08-17 NOTE — Discharge Instructions (Addendum)
You were seen for aches and pains that we believe are related to nonspecific muscle strain.  Your x-rays and labs are reassuring as well as your EKG.  Follow the instructions attached to your discharge paperwork.  You can continue using the medicines as prescribed today.  Continue gentle exercises at home.  Follow-up with your primary care doctor regarding your visit to the ER today.  Come back if any severe worsening uncontrolled pain, severe shortness of breath, high fevers, inability to move your arms, severe chest pain, or any other symptoms concerning to you.

## 2022-08-20 ENCOUNTER — Other Ambulatory Visit: Payer: Self-pay

## 2022-08-20 ENCOUNTER — Emergency Department (HOSPITAL_BASED_OUTPATIENT_CLINIC_OR_DEPARTMENT_OTHER)
Admission: EM | Admit: 2022-08-20 | Discharge: 2022-08-20 | Disposition: A | Discharge status: Home / Self Care | Payer: Medicaid Other | Attending: Emergency Medicine | Admitting: Emergency Medicine

## 2022-08-20 ENCOUNTER — Encounter (HOSPITAL_BASED_OUTPATIENT_CLINIC_OR_DEPARTMENT_OTHER): Payer: Self-pay | Admitting: Emergency Medicine

## 2022-08-20 DIAGNOSIS — R1110 Vomiting, unspecified: Secondary | ICD-10-CM

## 2022-08-20 DIAGNOSIS — R112 Nausea with vomiting, unspecified: Secondary | ICD-10-CM | POA: Diagnosis not present

## 2022-08-20 DIAGNOSIS — R6883 Chills (without fever): Secondary | ICD-10-CM | POA: Diagnosis not present

## 2022-08-20 DIAGNOSIS — Z1152 Encounter for screening for COVID-19: Secondary | ICD-10-CM | POA: Diagnosis not present

## 2022-08-20 DIAGNOSIS — M791 Myalgia, unspecified site: Secondary | ICD-10-CM | POA: Insufficient documentation

## 2022-08-20 LAB — SARS CORONAVIRUS 2 BY RT PCR: SARS Coronavirus 2 by RT PCR: NEGATIVE

## 2022-08-20 LAB — CBC WITH DIFFERENTIAL/PLATELET
Abs Immature Granulocytes: 0.05 10*3/uL (ref 0.00–0.07)
Basophils Absolute: 0.1 10*3/uL (ref 0.0–0.1)
Basophils Relative: 0 %
Eosinophils Absolute: 0 10*3/uL (ref 0.0–0.5)
Eosinophils Relative: 0 %
HCT: 39.3 % (ref 39.0–52.0)
Hemoglobin: 14.5 g/dL (ref 13.0–17.0)
Immature Granulocytes: 0 %
Lymphocytes Relative: 9 %
Lymphs Abs: 1.4 10*3/uL (ref 0.7–4.0)
MCH: 29.1 pg (ref 26.0–34.0)
MCHC: 36.9 g/dL — ABNORMAL HIGH (ref 30.0–36.0)
MCV: 78.9 fL — ABNORMAL LOW (ref 80.0–100.0)
Monocytes Absolute: 2 10*3/uL — ABNORMAL HIGH (ref 0.1–1.0)
Monocytes Relative: 13 %
Neutro Abs: 11.6 10*3/uL — ABNORMAL HIGH (ref 1.7–7.7)
Neutrophils Relative %: 78 %
Platelets: 372 10*3/uL (ref 150–400)
RBC: 4.98 MIL/uL (ref 4.22–5.81)
RDW: 13.2 % (ref 11.5–15.5)
WBC: 15.1 10*3/uL — ABNORMAL HIGH (ref 4.0–10.5)
nRBC: 0 % (ref 0.0–0.2)

## 2022-08-20 LAB — COMPREHENSIVE METABOLIC PANEL
ALT: 16 U/L (ref 0–44)
AST: 24 U/L (ref 15–41)
Albumin: 4.4 g/dL (ref 3.5–5.0)
Alkaline Phosphatase: 73 U/L (ref 38–126)
Anion gap: 12 (ref 5–15)
BUN: 15 mg/dL (ref 6–20)
CO2: 23 mmol/L (ref 22–32)
Calcium: 9.5 mg/dL (ref 8.9–10.3)
Chloride: 98 mmol/L (ref 98–111)
Creatinine, Ser: 1.32 mg/dL — ABNORMAL HIGH (ref 0.61–1.24)
GFR, Estimated: >60 mL/min (ref >60)
Glucose, Bld: 110 mg/dL — ABNORMAL HIGH (ref 70–99)
Potassium: 3.2 mmol/L — ABNORMAL LOW (ref 3.5–5.1)
Sodium: 133 mmol/L — ABNORMAL LOW (ref 135–145)
Total Bilirubin: 0.8 mg/dL (ref 0.3–1.2)
Total Protein: 8.3 g/dL — ABNORMAL HIGH (ref 6.5–8.1)

## 2022-08-20 MED ORDER — ONDANSETRON HCL 4 MG PO TABS: 4.0000 mg | ORAL_TABLET | Freq: Every day | ORAL | 1 refills | Status: DC | PRN

## 2022-08-20 MED ORDER — ONDANSETRON HCL 4 MG PO TABS: 4.0000 mg | ORAL_TABLET | Freq: Every day | ORAL | 1 refills | Status: AC | PRN

## 2022-08-20 MED ORDER — ACETAMINOPHEN 325 MG PO TABS
650.0000 mg | ORAL_TABLET | Freq: Four times a day (QID) | ORAL | Status: DC | PRN
  Administered 2022-08-20: 650 mg via ORAL
  Filled 2022-08-20: qty 2

## 2022-08-20 MED ORDER — ONDANSETRON HCL 4 MG/2ML IJ SOLN
4.0000 mg | Freq: Once | INTRAMUSCULAR | Status: AC
  Administered 2022-08-20: 4 mg via INTRAVENOUS
  Filled 2022-08-20: qty 2

## 2022-08-20 MED ORDER — POTASSIUM CHLORIDE CRYS ER 20 MEQ PO TBCR
20.0000 meq | EXTENDED_RELEASE_TABLET | Freq: Once | ORAL | Status: AC
  Administered 2022-08-20: 20 meq via ORAL
  Filled 2022-08-20: qty 1

## 2022-08-20 MED ORDER — SODIUM CHLORIDE 0.9 % IV BOLUS
1000.0000 mL | Freq: Once | INTRAVENOUS | Status: AC
  Administered 2022-08-20: 1000 mL via INTRAVENOUS

## 2022-08-20 NOTE — ED Provider Notes (Signed)
[facility] [facility] [place] Provider Note   CSN: [id number] Arrival date & time: 08/20/22  1108     History  Chief Complaint  Patient presents with   Chills    Lance Ball is a 37 y.o. male.  Pt complains of nausea, vomiting, fever and chills.  Pt reports symptoms began on 3/29 with body aches.  Pt reports he aches all over and has been vomiting.  Pt has not had tylenol today.  Pt denies any diarrhea.    The history is provided by the patient. No language interpreter was used.  Emesis Severity:  Moderate Timing:  Constant Quality:  Undigested food Progression:  Worsening Recent urination:  Normal Relieved by:  Nothing Associated symptoms: abdominal pain        Home Medications Prior to Admission medications   Medication Sig Start Date End Date Taking? Authorizing Provider  albuterol (VENTOLIN HFA) 108 (90 Base) MCG/ACT inhaler Inhale 1-2 puffs into the lungs every 4 (four) hours as needed for wheezing or shortness of breath. 04/03/19   Carlisle Cater, PA-C  amoxicillin-clavulanate (AUGMENTIN) 875-125 MG tablet Take 1 tablet by mouth every 12 (twelve) hours. 03/20/22   Tedd Sias, PA  cyclobenzaprine (FLEXERIL) 10 MG tablet Take 1 tablet (10 mg total) by mouth 2 (two) times daily as needed for muscle spasms. 03/03/22   Marcello Fennel, PA-C  HYDROcodone-acetaminophen (NORCO) 5-325 MG tablet Take 1 tablet by mouth every 4 (four) hours as needed for moderate pain. Q000111Q   Delora Fuel, MD  lidocaine (LIDODERM) 5 % Place 1 patch onto the skin daily. Remove & Discard patch within 12 hours or as directed by MD 08/17/22   Elgie Congo, MD  methocarbamol (ROBAXIN) 500 MG tablet Take 1 tablet (500 mg total) by mouth 2 (two) times daily as needed for muscle spasms. 08/17/22   Elgie Congo, MD  naproxen (NAPROSYN) 375 MG tablet Take 1 tablet (375 mg total) by mouth 2 (two) times daily. 08/17/22   Elgie Congo, MD   neomycin-polymyxin-hydrocortisone (CORTISPORIN) 3.5-10000-1 OTIC suspension Place 3 drops into the left ear 3 (three) times daily. X 7 days Q000111Q   Delora Fuel, MD      Allergies    Patient has no known allergies.    Review of Systems   Review of Systems  Gastrointestinal:  Positive for abdominal pain and vomiting.  All other systems reviewed and are negative.   Physical Exam Updated Vital Signs BP 133/80   Pulse 85   Temp 100 F (37.8 C)   Resp 16   Ht 5\' 11"  (1.803 m)   Wt 81.6 kg   SpO2 100%   BMI 25.10 kg/m  Physical Exam Vitals and nursing note reviewed.  Constitutional:      Appearance: He is well-developed.  HENT:     Head: Normocephalic.  Cardiovascular:     Rate and Rhythm: Normal rate.  Pulmonary:     Effort: Pulmonary effort is normal.  Abdominal:     General: Abdomen is flat. There is no distension.  Musculoskeletal:        General: Normal range of motion.     Cervical back: Normal range of motion.  Neurological:     General: No focal deficit present.     Mental Status: He is alert and oriented to person, place, and time.  Psychiatric:        Mood and Affect: Mood normal.     ED Results /  Procedures / Treatments   Labs (all labs ordered are listed, but only abnormal results are displayed) Labs Reviewed  CBC WITH DIFFERENTIAL/PLATELET - Abnormal; Notable for the following components:      Result Value   WBC 15.1 (*)    MCV 78.9 (*)    MCHC 36.9 (*)    Neutro Abs 11.6 (*)    Monocytes Absolute 2.0 (*)    All other components within normal limits  COMPREHENSIVE METABOLIC PANEL - Abnormal; Notable for the following components:   Sodium 133 (*)    Potassium 3.2 (*)    Glucose, Bld 110 (*)    Creatinine, Ser 1.32 (*)    Total Protein 8.3 (*)    All other components within normal limits  SARS CORONAVIRUS 2 BY RT PCR    EKG None  Radiology No results found.  Procedures Procedures    Medications Ordered in ED Medications   acetaminophen (TYLENOL) tablet 650 mg (650 mg Oral Given 08/20/22 1220)  sodium chloride 0.9 % bolus 1,000 mL (0 mLs Intravenous Stopped 08/20/22 1306)  ondansetron (ZOFRAN) injection 4 mg (4 mg Intravenous Given 08/20/22 1158)  potassium chloride SA (KLOR-CON M) CR tablet 20 mEq (20 mEq Oral Given 08/20/22 1313)    ED Course/ Medical Decision Making/ A&P                             Medical Decision Making Pt complains of vomitting and bodyaches  Amount and/or Complexity of Data Reviewed Labs: ordered. Decision-making details documented in ED Course.    Details: Labs ordered reviewed and interpreted,  Pt has a wbc count of 15.  Potassium is 3.2  Risk OTC drugs. Prescription drug management. Risk Details: Pt given iv ns x 1 liter, acetaminophen and zofran.  Pt advised clear liquids,  rx for zofran.              Final Clinical Impression(s) / ED Diagnoses Final diagnoses:  Vomiting, unspecified vomiting type, unspecified whether nausea present  Myalgia    Rx / DC Orders ED Discharge Orders          Ordered    ondansetron (ZOFRAN) 4 MG tablet  Daily PRN        08/20/22 1327           An After Visit Summary was printed and given to the patient.    Fransico Meadow, [place] 08/20/22 1329    Wyvonnia Dusky, MD 08/20/22 [phone]

## 2022-08-20 NOTE — Discharge Instructions (Addendum)
Return if any problems.  Clear liquid diet for the next 12 hours.

## 2022-08-20 NOTE — ED Triage Notes (Signed)
Patient presents C/O generalized body aches, subjective fevers, chills, N/V X few days. Was seen 3/29 for same but S/S were not as severe then. Dx with muscle strain

## 2022-08-21 ENCOUNTER — Other Ambulatory Visit: Payer: Self-pay

## 2022-08-21 ENCOUNTER — Emergency Department (HOSPITAL_BASED_OUTPATIENT_CLINIC_OR_DEPARTMENT_OTHER)
Admission: EM | Admit: 2022-08-21 | Discharge: 2022-08-21 | Disposition: A | Discharge status: Home / Self Care | Payer: Medicaid Other | Attending: Emergency Medicine | Admitting: Emergency Medicine

## 2022-08-21 ENCOUNTER — Encounter (HOSPITAL_BASED_OUTPATIENT_CLINIC_OR_DEPARTMENT_OTHER): Payer: Self-pay | Admitting: Emergency Medicine

## 2022-08-21 DIAGNOSIS — J020 Streptococcal pharyngitis: Secondary | ICD-10-CM

## 2022-08-21 DIAGNOSIS — J029 Acute pharyngitis, unspecified: Secondary | ICD-10-CM | POA: Diagnosis present

## 2022-08-21 LAB — GROUP A STREP BY PCR: Group A Strep by PCR: NOT DETECTED

## 2022-08-21 MED ORDER — PENICILLIN V POTASSIUM 500 MG PO TABS: 500.0000 mg | ORAL_TABLET | Freq: Two times a day (BID) | ORAL | 0 refills | Status: AC

## 2022-08-21 NOTE — Discharge Instructions (Addendum)
It was a pleasure taking care of you today!  Although your strep test was negative today in the ED, we will treat based off the appearance of your tonsils. You will be sent a prescription for penicillin.  You may take over-the-counter 600 mg ibuprofen every 6 hours and alternate with 500 mg Tylenol every 6 hours as needed for pain for no more than 7 days.  Ensure to maintain fluid intake with tea, broth, soup, Gatorade, Pedialyte, water.  You may follow-up with your primary care provider as needed.  Attached is a Warden/ranger for dentist as well. Return to the emergency department if experiencing increasing/worsening trouble swallowing, trouble breathing, fever, worsening symptoms.

## 2022-08-21 NOTE — ED Provider Notes (Signed)
[place] [facility] [place] [facility] Provider Note   CSN: [id number] Arrival date & time: 08/21/22  1028     History  Chief Complaint  Patient presents with   Sore Throat    Lance Ball is a 37 y.o. male who presents emergency department with concerns for sore throat onset this morning.  Was evaluated in emergency department yesterday and diagnosed with URI. Was unable to be evaluated for his previous ED visit. Has painful swallowing, rhinorrhea, nasal congestion. No meds tried. Denies trouble breathing, trouble swallowing, fever.   The history is provided by the patient. No language interpreter was used.       Home Medications Prior to Admission medications   Medication Sig Start Date End Date Taking? Authorizing Provider  penicillin v potassium (VEETID) 500 MG tablet Take 1 tablet (500 mg total) by mouth 2 (two) times daily for 10 days. 08/21/22 08/31/22 Yes Virdell Hoiland A, PA-C  albuterol (VENTOLIN HFA) 108 (90 Base) MCG/ACT inhaler Inhale 1-2 puffs into the lungs every 4 (four) hours as needed for wheezing or shortness of breath. 04/03/19   Carlisle Cater, PA-C  amoxicillin-clavulanate (AUGMENTIN) 875-125 MG tablet Take 1 tablet by mouth every 12 (twelve) hours. 03/20/22   Tedd Sias, PA  cyclobenzaprine (FLEXERIL) 10 MG tablet Take 1 tablet (10 mg total) by mouth 2 (two) times daily as needed for muscle spasms. 03/03/22   Marcello Fennel, PA-C  HYDROcodone-acetaminophen (NORCO) 5-325 MG tablet Take 1 tablet by mouth every 4 (four) hours as needed for moderate pain. Q000111Q   Delora Fuel, MD  lidocaine (LIDODERM) 5 % Place 1 patch onto the skin daily. Remove & Discard patch within 12 hours or as directed by MD 08/17/22   Elgie Congo, MD  methocarbamol (ROBAXIN) 500 MG tablet Take 1 tablet (500 mg total) by mouth 2 (two) times daily as needed for muscle spasms. 08/17/22   Elgie Congo, MD  naproxen (NAPROSYN) 375 MG tablet Take 1 tablet  (375 mg total) by mouth 2 (two) times daily. 08/17/22   Elgie Congo, MD  neomycin-polymyxin-hydrocortisone (CORTISPORIN) 3.5-10000-1 OTIC suspension Place 3 drops into the left ear 3 (three) times daily. X 7 days Q000111Q   Delora Fuel, MD  ondansetron (ZOFRAN) 4 MG tablet Take 1 tablet (4 mg total) by mouth daily as needed for nausea or vomiting. 08/20/22 08/20/23  Fransico Meadow, PA-C      Allergies    Patient has no known allergies.    Review of Systems   Review of Systems  All other systems reviewed and are negative.   Physical Exam Updated Vital Signs BP 129/86   Pulse 85   Temp 99.2 F (37.3 C) (Oral)   Resp 18   SpO2 99%  Physical Exam Vitals and nursing note reviewed.  Constitutional:      General: He is not in acute distress.    Appearance: Normal appearance.  HENT:     Mouth/Throat:     Mouth: Mucous membranes are moist.     Pharynx: Oropharynx is clear. Uvula midline. No posterior oropharyngeal erythema.     Tonsils: Tonsillar exudate present. No tonsillar abscesses.     Comments: Uvula midline without swelling. No posterior pharyngeal erythema noted. Mild tonsillar exudate noted bilaterally. Patent airway. Pt able to speak in clear complete sentences. Tolerating oral secretions. Eyes:     General: No scleral icterus.    Extraocular Movements: Extraocular movements intact.  Cardiovascular:  Rate and Rhythm: Normal rate.  Pulmonary:     Effort: Pulmonary effort is normal. No respiratory distress.  Abdominal:     Palpations: Abdomen is soft. There is no mass.     Tenderness: There is no abdominal tenderness.  Musculoskeletal:        General: Normal range of motion.     Cervical back: Neck supple.  Skin:    General: Skin is warm and dry.     Findings: No rash.  Neurological:     Mental Status: He is alert.     Sensory: Sensation is intact.     Motor: Motor function is intact.  Psychiatric:        Behavior: Behavior normal.     ED Results /  Procedures / Treatments   Labs (all labs ordered are listed, but only abnormal results are displayed) Labs Reviewed  GROUP A STREP BY PCR    EKG None  Radiology No results found.  Procedures Procedures    Medications Ordered in ED Medications - No data to display  ED Course/ Medical Decision Making/ A&P                             Medical Decision Making  Patient with sore throat onset 5 days.  Denies sick contacts.  On exam patient with Uvula midline without swelling. No posterior pharyngeal erythema noted. Mild tonsillar exudate noted bilaterally. Patent airway. Pt able to speak in clear complete sentences. Tolerating oral secretions.  Differential diagnosis includes strep pharyngitis, peritonsillar abscess, strep pharyngitis, or Ludwig's angina.   Labs:  I ordered, and personally interpreted labs.  The pertinent results include:   Strep swab negative.  Disposition: Pt presentation suspicious for strep pharyngitis despite negative test. Patent airway, tolerating secretions, no concern for airway compromise. Less likely Ludwigs angina, no trismus or edema to floor of mouth on exam. Less likely peritonsillar abscess, no fluctuant abscess noted on exam, patent airway, oxygen saturation 100%, water and tolerating secretions. After consideration of the diagnostic results and the patients response to treatment, I feel that the patient would benefit from Discharge home. Work note provided. Pt sent Rx for penicillin. Strict return precautions discussed including inability to tolerate secretions, fever, inability to open mouth.  Patient acknowledges and verbalizes understanding.  Recommended primary care follow-up.  Patient appears safe for discharge at this time.  Follow-up as indicated in discharge paperwork.  This chart was dictated using voice recognition software, Dragon. Despite the best efforts of this provider to proofread and correct errors, errors may still occur which can  change documentation meaning.   Final Clinical Impression(s) / ED Diagnoses Final diagnoses:  Strep pharyngitis    Rx / DC Orders ED Discharge Orders          Ordered    penicillin v potassium (VEETID) 500 MG tablet  2 times daily        08/21/22 1151              Howie Rufus A, PA-C 08/21/22 1156    Lajean Saver, MD 08/22/22 [phone]

## 2022-08-21 NOTE — ED Triage Notes (Signed)
URI yesterday , was seen here , today presents with sore throat . Nausea .

## 2022-10-16 ENCOUNTER — Ambulatory Visit
Admission: EM | Admit: 2022-10-16 | Discharge: 2022-10-16 | Disposition: A | Discharge status: Home / Self Care | Payer: Medicaid Other | Attending: Nurse Practitioner | Admitting: Nurse Practitioner

## 2022-10-16 DIAGNOSIS — S46011A Strain of muscle(s) and tendon(s) of the rotator cuff of right shoulder, initial encounter: Secondary | ICD-10-CM | POA: Diagnosis not present

## 2022-10-16 DIAGNOSIS — A630 Anogenital (venereal) warts: Secondary | ICD-10-CM

## 2022-10-16 MED ORDER — NAPROXEN 500 MG PO TABS: 500.0000 mg | ORAL_TABLET | Freq: Two times a day (BID) | ORAL | 0 refills | Status: AC

## 2022-10-16 MED ORDER — KETOROLAC TROMETHAMINE 30 MG/ML IJ SOLN
30.0000 mg | Freq: Once | INTRAMUSCULAR | Status: AC
  Administered 2022-10-16: 30 mg via INTRAMUSCULAR

## 2022-10-16 MED ORDER — CYCLOBENZAPRINE HCL 10 MG PO TABS: 10.0000 mg | ORAL_TABLET | Freq: Two times a day (BID) | ORAL | 0 refills | Status: AC | PRN

## 2022-10-16 NOTE — ED Triage Notes (Signed)
Pt presents with c/o rt shoulder pain X 1 hour.  Pt was laying down on his shoulder, turned over and felt a sharp pain going from the shoulder down to the arm. C/o constant stinging pain.

## 2022-10-16 NOTE — Discharge Instructions (Addendum)
You were given a Toradol injection in clinic today. Do not take any over the counter NSAID's such as Advil, ibuprofen, Aleve, or naproxen for 24 hours.  You may take tylenol if needed Start naproxen twice daily for 7 days tomorrow, 5/29 Flexeril as needed.  Please note this medication can make you drowsy.  Do not drink alcohol or drive while on this medication Rest and heat to the shoulder as needed Follow-up with your PCP if your symptoms do not improve Please go to the ER for any worsening symptoms

## 2022-10-16 NOTE — ED Provider Notes (Signed)
[facility]    CSN: [id number] Arrival date & time: 10/16/22  0935      History   Chief Complaint Chief Complaint  Patient presents with   Shoulder Pain    HPI Lance Ball is a 37 y.o. male presents for evaluation of shoulder pain.  Patient reports 1 hour prior to arrival he rolled over in bed and had a sharp anterior right shoulder pain that is intermittent and occurs only with movement.  He is not sure what he did to make it hurt.  No neck pain or posterior shoulder pain.  No numbness or tingling or weakness of his upper extremities.  No swelling or bruising.  No history of right shoulder surgeries or fractures in the past.  He has not taken any OTC medications for symptoms.  No other concerns at this time.   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 Authorizing Provider  cyclobenzaprine (FLEXERIL) 10 MG tablet Take 1 tablet (10 mg total) by mouth 2 (two) times daily as needed for muscle spasms. 10/16/22  Yes Radford Pax, NP  naproxen (NAPROSYN) 500 MG tablet Take 1 tablet (500 mg total) by mouth 2 (two) times daily for 7 days. 10/17/22 10/24/22 Yes Radford Pax, NP  albuterol (VENTOLIN HFA) 108 (90 Base) MCG/ACT inhaler Inhale 1-2 puffs into the lungs every 4 (four) hours as needed for wheezing or shortness of breath. 04/03/19   Renne Crigler, PA-C  amoxicillin-clavulanate (AUGMENTIN) 875-125 MG tablet Take 1 tablet by mouth every 12 (twelve) hours. 03/20/22   Gailen Shelter, PA  HYDROcodone-acetaminophen (NORCO) 5-325 MG tablet Take 1 tablet by mouth every 4 (four) hours as needed for moderate pain. 05/03/20   Dione Booze, MD  lidocaine (LIDODERM) 5 % Place 1  patch onto the skin daily. Remove & Discard patch within 12 hours or as directed by MD 08/17/22   Mardene Sayer, MD  neomycin-polymyxin-hydrocortisone (CORTISPORIN) 3.5-10000-1 OTIC suspension Place 3 drops into the left ear 3 (three) times daily. X 7 days 05/03/20   Dione Booze, MD  ondansetron (ZOFRAN) 4 MG tablet Take 1 tablet (4 mg total) by mouth daily as needed for nausea or vomiting. 08/20/22 08/20/23  Elson Areas, PA-C    Family History Family History  Family history unknown: Yes    Social History Social History   Tobacco Use   Smoking status: Some Days    Packs/day: .5    Types: Cigarettes   Smokeless tobacco: Never   Tobacco comments:    2-3 cigarettes/week  Vaping Use   Vaping Use: Never used  Substance Use Topics   Alcohol use: Yes    Alcohol/week: 2.0 standard drinks of alcohol    Types: 2 Standard drinks or equivalent per week    Comment: social   Drug use: Yes    Types: Marijuana     Allergies   Patient has no known allergies.   Review of Systems Review of Systems  Musculoskeletal:        Right shoulder pain      Physical Exam Triage Vital Signs ED Triage Vitals  Enc Vitals Group     BP 10/16/22 0956 122/85     Pulse Rate 10/16/22 0956 63     Resp 10/16/22 0956 17     Temp 10/16/22 0956 98.2 F (36.8 C)     Temp Source 10/16/22 0956 Oral     SpO2 10/16/22 0956 97 %     Weight --      Height --      Head Circumference --      Peak Flow --      Pain Score 10/16/22 0955 7     Pain Loc --      Pain Edu? --      Excl. in GC? --    No data found.  Updated Vital Signs BP 122/85 (BP Location: Left Arm)   Pulse 63   Temp 98.2 F (36.8 C) (Oral)   Resp 17   SpO2 97%   Visual Acuity Right Eye Distance:   Left Eye Distance:   Bilateral Distance:    Right Eye Near:   Left Eye Near:    Bilateral Near:     Physical Exam Vitals and nursing note reviewed.  Constitutional:      General: He is not in acute distress.     Appearance: Normal appearance. He is not ill-appearing, toxic-appearing or diaphoretic.  HENT:     Head: Normocephalic and atraumatic.  Eyes:     Pupils: Pupils are equal, round, and reactive to light.  Cardiovascular:     Rate and Rhythm: Normal rate.  Pulmonary:     Effort: Pulmonary effort is normal.  Musculoskeletal:     Right shoulder: No swelling, deformity, effusion, laceration, tenderness, bony tenderness or crepitus. Decreased range of motion. Normal strength. Normal pulse.     Comments: There is no tenderness to palpation to the right anterior, lateral, or posterior shoulder.  No tenderness over clavicle.  There is anterior pain with active lateral or frontal raise to about 90 degrees.  Strength is 5 out of 5 bilateral upper extremities.  Positive Leanord Asal test.  Skin:    General: Skin is warm and dry.  Neurological:     General: No focal deficit present.     Mental Status: He is alert and oriented to person, place, and time.  Psychiatric:        Mood and Affect: Mood normal.        Behavior: Behavior normal.      UC Treatments / Results  Labs (all labs ordered are listed, but only abnormal results are displayed) Labs Reviewed - No data to display  Comprehensive metabolic panel Order: [id number] Status: Final result     Visible to patient: Yes (not seen)     Next appt: None   0 Result Notes        Component Ref Range & Units 1 mo ago 2 mo ago 4 yr ago 5 yr ago 11 yr ago  Sodium 135 - 145 mmol/L 133 Low  134 Low  139 141 138 R  Potassium 3.5 - 5.1 mmol/L 3.2 Low  3.5 3.9 3.8 4.1 R  Chloride 98 - 111 mmol/L 98 103 106 110 R 105 R  CO2 22 - 32 mmol/L 23 21 Low  26 24 23  R  Glucose, Bld 70 - 99 mg/dL 409 High  811 High  CM 110 High  104 High  R 98  Comment: Glucose reference range applies only to samples taken after fasting for at least 8 hours.  BUN 6 - 20 mg/dL 15 14 16 20 12  R  Creatinine, Ser 0.61 - 1.24 mg/dL 9.81 High  1.91 4.78 2.95 1.00 R   Calcium 8.9 - 10.3 mg/dL 9.5 9.1 8.8 Low  8.4 Low  9.3 R  Total Protein 6.5 - 8.1 g/dL 8.3 High   5.7 Low  5.2 Low  6.2 R  Albumin 3.5 - 5.0 g/dL 4.4  3.5 3.2 Low  3.5 R  AST 15 - 41 U/L 24  18 20 11  R  ALT 0 - 44 U/L 16  12 11  Low  R 12 R  Alkaline Phosphatase 38 - 126 U/L 73  45 43 63 R  Total Bilirubin 0.3 - 1.2 mg/dL 0.8  1.1 0.5 0.4  GFR, Estimated >60 mL/min >60 >60 CM     Comment: (NOTE) Calculated using the CKD-EPI Creatinine Equation (2021)  Anion gap 5 - 15 12 10  CM 7 CM 7   Comment: Performed at [facility], [address] [place], [place] [address]  Resulting Agency [facility]         Specimen Collected: 08/20/22 12:10 Last Resulted: 08/20/22 12:34           EKG   Radiology No results found.  Procedures Procedures (including critical care time)  Medications Ordered in UC Medications  ketorolac (TORADOL) 30 MG/ML injection 30 mg (has no administration in time range)    Initial Impression / Assessment and Plan / UC Course  I have reviewed the triage vital signs and the nursing notes.  Pertinent labs & imaging results that were available during my care of the patient were reviewed by me and considered in my medical decision making (see chart for details).     Reviewed exam and symptoms with patient.  Discussed likely rotator cuff strain Patient given Toradol injection in clinic.  Monitored for 10 minutes after injection with no reaction noted and tolerated well.  He was instructed no NSAIDs for 24 hours and verbalized understanding He will start naproxen tomorrow Flexeril as needed.  Side effect profile reviewed Rest and heat to the shoulder as needed PCP follow-up if symptoms do not improve ER precautions reviewed and patient verbalized understanding Final Clinical Impressions(s) / UC Diagnoses   Final diagnoses:  Strain of right rotator cuff capsule, initial encounter  Genital  condyloma, male     Discharge Instructions      You were given a Toradol injection in clinic today. Do not take any over the counter NSAID's such as Advil, ibuprofen, Aleve, or naproxen for 24 hours.  You may take tylenol if needed Start naproxen twice daily for 7 days tomorrow, 5/29 Flexeril as needed.  Please note this medication can make you drowsy.  Do not drink alcohol or drive while on this medication Rest and heat to the shoulder as needed Follow-up with your PCP if your symptoms do not improve Please go to the ER for any worsening symptoms     ED Prescriptions     Medication Sig Dispense Auth. Provider   naproxen (NAPROSYN) 500 MG tablet Take 1 tablet (500 mg total) by mouth 2 (two) times daily for 7 days. 14 tablet Radford Pax, NP   cyclobenzaprine (FLEXERIL) 10 MG tablet Take 1 tablet (10 mg total) by mouth 2 (two) times daily as needed for muscle spasms. 10 tablet Radford Pax, NP      PDMP  not reviewed this encounter.   Radford Pax, NP 10/16/22 1027

## 2023-01-15 ENCOUNTER — Emergency Department (HOSPITAL_BASED_OUTPATIENT_CLINIC_OR_DEPARTMENT_OTHER)
Admission: EM | Admit: 2023-01-15 | Discharge: 2023-01-15 | Disposition: A | Discharge status: Home / Self Care | Payer: Medicaid Other | Attending: Emergency Medicine | Admitting: Emergency Medicine

## 2023-01-15 ENCOUNTER — Other Ambulatory Visit: Payer: Self-pay

## 2023-01-15 ENCOUNTER — Encounter (HOSPITAL_BASED_OUTPATIENT_CLINIC_OR_DEPARTMENT_OTHER): Payer: Self-pay

## 2023-01-15 DIAGNOSIS — L02215 Cutaneous abscess of perineum: Secondary | ICD-10-CM | POA: Insufficient documentation

## 2023-01-15 DIAGNOSIS — L0291 Cutaneous abscess, unspecified: Secondary | ICD-10-CM

## 2023-01-15 MED ORDER — IBUPROFEN 600 MG PO TABS: 600.0000 mg | ORAL_TABLET | Freq: Four times a day (QID) | ORAL | 0 refills | Status: AC | PRN

## 2023-01-15 MED ORDER — DOXYCYCLINE HYCLATE 100 MG PO CAPS: 100.0000 mg | ORAL_CAPSULE | Freq: Two times a day (BID) | ORAL | 0 refills | Status: AC

## 2023-01-15 MED ORDER — DOXYCYCLINE HYCLATE 100 MG PO TABS
100.0000 mg | ORAL_TABLET | Freq: Once | ORAL | Status: AC
  Administered 2023-01-15: 100 mg via ORAL
  Filled 2023-01-15: qty 1

## 2023-01-15 MED ORDER — OXYCODONE-ACETAMINOPHEN 5-325 MG PO TABS
1.0000 | ORAL_TABLET | Freq: Once | ORAL | Status: AC
  Administered 2023-01-15: 1 via ORAL
  Filled 2023-01-15: qty 1

## 2023-01-15 NOTE — ED Provider Notes (Signed)
[place] Provider Note   CSN: [id number] Arrival date & time: 01/15/23  1918     History  Chief Complaint  Patient presents with   Abscess    Lance Ball is a 37 y.o. male.  Patient to ED for evaluation of painful swollen area to the perineal area  x 2 weeks. He has tried to open the area at home and reports scant purulent drainage. No fever. History of abscess previously. He reports a second sore area to the proximal left thigh without drainage.   The history is provided by the patient. No language interpreter was used.  Abscess      Home Medications Prior to Admission medications   Medication Sig Start Date End Date Taking? Authorizing Provider  doxycycline (VIBRAMYCIN) 100 MG capsule Take 1 capsule (100 mg total) by mouth 2 (two) times daily. 01/15/23  Yes Gissella Niblack, Melvenia Beam, PA-C  ibuprofen (ADVIL) 600 MG tablet Take 1 tablet (600 mg total) by mouth every 6 (six) hours as needed. 01/15/23  Yes Maeghan Canny, Melvenia Beam, PA-C  albuterol (VENTOLIN HFA) 108 (90 Base) MCG/ACT inhaler Inhale 1-2 puffs into the lungs every 4 (four) hours as needed for wheezing or shortness of breath. 04/03/19   Renne Crigler, PA-C  amoxicillin-clavulanate (AUGMENTIN) 875-125 MG tablet Take 1 tablet by mouth every 12 (twelve) hours. 03/20/22   Gailen Shelter, PA  cyclobenzaprine (FLEXERIL) 10 MG tablet Take 1 tablet (10 mg total) by mouth 2 (two) times daily as needed for muscle spasms. 10/16/22   Radford Pax, NP  HYDROcodone-acetaminophen (NORCO) 5-325 MG tablet Take 1 tablet by mouth every 4 (four) hours as needed for moderate pain. 05/03/20   Dione Booze, MD  lidocaine (LIDODERM) 5 % Place 1 patch onto the skin daily. Remove & Discard patch within 12 hours or as directed by MD 08/17/22   Mardene Sayer, MD  neomycin-polymyxin-hydrocortisone (CORTISPORIN) 3.5-10000-1 OTIC suspension Place 3 drops into the left ear 3 (three) times daily. X 7 days 05/03/20    Dione Booze, MD  ondansetron (ZOFRAN) 4 MG tablet Take 1 tablet (4 mg total) by mouth daily as needed for nausea or vomiting. 08/20/22 08/20/23  Elson Areas, PA-C      Allergies    Patient has no known allergies.    Review of Systems   Review of Systems  Physical Exam Updated Vital Signs BP (!) 138/101 (BP Location: Left Arm)   Pulse 76   Temp 98.7 F (37.1 C) (Temporal)   Resp 18   Ht 5\' 11"  (1.803 m)   Wt 83.9 kg   SpO2 98%   BMI 25.80 kg/m  Physical Exam Constitutional:      General: He is not in acute distress.    Appearance: Normal appearance.  Genitourinary:    Comments: Small 3 mm nodule without surrounding induration or fluctuance in left perineum. No redness or drainage. Similar finding at left medial proximal thigh.  Skin:    General: Skin is warm and dry.     Findings: No erythema.  Neurological:     Mental Status: He is alert.     ED Results / Procedures / Treatments   Labs (all labs ordered are listed, but only abnormal results are displayed) Labs Reviewed - No data to display  EKG None  Radiology No results found.  Procedures Procedures    Medications Ordered in ED Medications  doxycycline (VIBRA-TABS) tablet 100 mg (has no administration in time range)  oxyCODONE-acetaminophen (PERCOCET/ROXICET) 5-325 MG per tablet 1 tablet (1 tablet Oral Given 01/15/23 2231)    ED Course/ Medical Decision Making/ A&P                                 Medical Decision Making Patient concerned for recurrent abscess to perineum, with second concerning area to medial thigh as per H&P.   Amount and/or Complexity of Data Reviewed Discussion of management or test interpretation with external provider(s): No evidence of accumulation, therefore, no indication for I&D. Will cover with abx, ibuprofen. Encouraged PCP follow up. Warm compresses also recommended.   Risk Prescription drug management.           Final Clinical Impression(s) / ED  Diagnoses Final diagnoses:  Abscess    Rx / DC Orders ED Discharge Orders          Ordered    doxycycline (VIBRAMYCIN) 100 MG capsule  2 times daily        01/15/23 2254    ibuprofen (ADVIL) 600 MG tablet  Every 6 hours PRN        01/15/23 2254              Elpidio Anis, PA-C 01/15/23 2255    Benjiman Core, MD 01/16/23 [phone]

## 2023-01-15 NOTE — ED Triage Notes (Signed)
Abscessed area in perineal area x 2 weeks Has soaked area Unbearable pain

## 2023-01-15 NOTE — Discharge Instructions (Signed)
Recommend warm compresses to the area. Take Doxycycline as prescribed. Return to the ED with any fever, significant pain or increasing area of hardness in the area as we discussed.

## 2023-02-16 ENCOUNTER — Other Ambulatory Visit: Payer: Self-pay

## 2023-02-16 ENCOUNTER — Encounter (HOSPITAL_BASED_OUTPATIENT_CLINIC_OR_DEPARTMENT_OTHER): Payer: Self-pay

## 2023-02-16 ENCOUNTER — Ambulatory Visit
Admission: EM | Admit: 2023-02-16 | Discharge: 2023-02-16 | Disposition: A | Discharge status: Home / Self Care | Payer: Medicaid Other

## 2023-02-16 ENCOUNTER — Emergency Department (HOSPITAL_BASED_OUTPATIENT_CLINIC_OR_DEPARTMENT_OTHER)
Admission: EM | Admit: 2023-02-16 | Discharge: 2023-02-16 | Disposition: A | Discharge status: Home / Self Care | Payer: Medicaid Other | Attending: Emergency Medicine | Admitting: Emergency Medicine

## 2023-02-16 DIAGNOSIS — X509XXA Other and unspecified overexertion or strenuous movements or postures, initial encounter: Secondary | ICD-10-CM | POA: Insufficient documentation

## 2023-02-16 DIAGNOSIS — Y9362 Activity, american flag or touch football: Secondary | ICD-10-CM | POA: Insufficient documentation

## 2023-02-16 DIAGNOSIS — S60031A Contusion of right middle finger without damage to nail, initial encounter: Secondary | ICD-10-CM | POA: Insufficient documentation

## 2023-02-16 DIAGNOSIS — M79644 Pain in right finger(s): Secondary | ICD-10-CM | POA: Diagnosis present

## 2023-02-16 MED ORDER — CYCLOBENZAPRINE HCL 10 MG PO TABS: 10.0000 mg | ORAL_TABLET | Freq: Two times a day (BID) | ORAL | 0 refills | Status: AC | PRN

## 2023-02-16 NOTE — ED Provider Notes (Signed)
[place] Provider Note   CSN: [id number] Arrival date & time: 02/16/23  8657     History  Chief Complaint  Patient presents with   Hand Pain    Lance Ball is a 37 y.o. male presenting with right middle finger pain for the past 10 years.  Patient disease play flag football and at 1 point while playing had his finger popped out of place and he put a bag in place and since then has been aching him.  Patient states he has tried ibuprofen Tylenol and buddy tape heat and cold however hemoglobin helped minimally.  Patient states he still able move his finger around and perform his work without issue however wants further relief.  Patient states that muscle relaxers helped in the past.  Patient does not see a primary care provider as he states he has an outstanding balance elsewhere but is unsure as to why it is there and so he has not seen a primary care provider.  Patient denies fevers, finger swelling, decreased sensation/motor skill, skin color changes  Home Medications Prior to Admission medications   Medication Sig Start Date End Date Taking? Authorizing Provider  cyclobenzaprine (FLEXERIL) 10 MG tablet Take 1 tablet (10 mg total) by mouth 2 (two) times daily as needed for muscle spasms. 02/16/23  Yes Azalya Galyon, Beverly Gust, PA-C  albuterol (VENTOLIN HFA) 108 (90 Base) MCG/ACT inhaler Inhale 1-2 puffs into the lungs every 4 (four) hours as needed for wheezing or shortness of breath. 04/03/19   Renne Crigler, PA-C  amoxicillin-clavulanate (AUGMENTIN) 875-125 MG tablet Take 1 tablet by mouth every 12 (twelve) hours. 03/20/22   Gailen Shelter, PA  cyclobenzaprine (FLEXERIL) 10 MG tablet Take 1 tablet (10 mg total) by mouth 2 (two) times daily as needed for muscle spasms. 10/16/22   Radford Pax, NP  doxycycline (VIBRAMYCIN) 100 MG capsule Take 1 capsule (100 mg total) by mouth 2 (two) times daily. 01/15/23   Elpidio Anis, PA-C   HYDROcodone-acetaminophen (NORCO) 5-325 MG tablet Take 1 tablet by mouth every 4 (four) hours as needed for moderate pain. 05/03/20   Dione Booze, MD  ibuprofen (ADVIL) 600 MG tablet Take 1 tablet (600 mg total) by mouth every 6 (six) hours as needed. 01/15/23   Elpidio Anis, PA-C  lidocaine (LIDODERM) 5 % Place 1 patch onto the skin daily. Remove & Discard patch within 12 hours or as directed by MD 08/17/22   Mardene Sayer, MD  neomycin-polymyxin-hydrocortisone (CORTISPORIN) 3.5-10000-1 OTIC suspension Place 3 drops into the left ear 3 (three) times daily. X 7 days 05/03/20   Dione Booze, MD  ondansetron (ZOFRAN) 4 MG tablet Take 1 tablet (4 mg total) by mouth daily as needed for nausea or vomiting. 08/20/22 08/20/23  Elson Areas, PA-C      Allergies    Patient has no known allergies.    Review of Systems   Review of Systems  Physical Exam Updated Vital Signs BP (!) 138/92 (BP Location: Right Arm)   Pulse 64   Temp 98 F (36.7 C) (Oral)   Resp 18   SpO2 99%  Physical Exam Constitutional:      General: He is not in acute distress.    Comments: Resting comfortably in the room moving his hand around including the finger in question  Cardiovascular:     Rate and Rhythm: Normal rate.     Pulses: Normal pulses.  Musculoskeletal:     Comments:  Right middle finger: Not held in flexion, no flexor tendon tenderness, able to passively range without pain, 5 out of 5 extension/flexion, no bony abnormalities or tenderness, soft compartments, pain not out of proportion, unable to reproduce patient's pain to palpation Pain not out of proportion Soft compartments  Skin:    General: Skin is warm and dry.     Capillary Refill: Capillary refill takes less than 2 seconds.  Neurological:     Mental Status: He is alert.     Comments: Sensation intact distally     ED Results / Procedures / Treatments   Labs (all labs ordered are listed, but only abnormal results are displayed) Labs  Reviewed - No data to display  EKG None  Radiology No results found.  Procedures Procedures    Medications Ordered in ED Medications - No data to display  ED Course/ Medical Decision Making/ A&P                                 Medical Decision Making  Lance Ball 37 y.o. presented today for right middle finger pain. Working DDx that I considered at this time includes, but not limited to, contusion, strain/sprain, fracture, dislocation, neurovascular compromise, septic joint, ischemic limb, compartment syndrome.  R/o DDx: strain/sprain, fracture, dislocation, neurovascular compromise, septic joint, ischemic limb, compartment syndrome: These are considered less likely due to history of present illness, physical exam, labs/imaging findings.  Review of prior external notes: 01/15/2023 ED  Unique Tests and My Interpretation: None  Discussion with Independent Historian: None  Discussion of Management of Tests: None  Risk: Medium: prescription drug management  Risk Stratification Score: None  Plan: On exam patient was in no acute distress stable vitals.  Patient was neurovascular intact and did not show any concerning features on exam.  I spoke to the patient we agreed to forego a digital block at this time is only given temporary relief and that patient will need long-term management.  Patient is happy to see primary care at this time and so he will be referred to primary care.  Encourage patient to buddy wrap his fingers along with taking anti-inflammatories every 6 hours and use heat or ice as he stated that he utilized to help for [place] periods of time.  After the patient a work note for light duty however patient declined.  At this time have low concern for any life-threatening pathology given chronicity and reassuring physical exam.  Patient was given return precautions. Patient stable for discharge at this time.  Patient verbalized understanding of plan.  This chart was  dictated using voice recognition software.  Despite best efforts to proofread,  errors can occur which can change the documentation meaning.         Final Clinical Impression(s) / ED Diagnoses Final diagnoses:  Contusion of right middle finger without damage to nail, initial encounter    Rx / DC Orders ED Discharge Orders          Ordered    cyclobenzaprine (FLEXERIL) 10 MG tablet  2 times daily PRN        02/16/23 0923              Netta Corrigan, PA-C 02/16/23 0942    Long, Arlyss Repress, MD 02/16/23 1010

## 2023-02-16 NOTE — ED Notes (Signed)
Reviewed discharge instructions with pt. States understanding

## 2023-02-16 NOTE — ED Notes (Signed)
Pt states he needs the injection direct in the finger. Explained we can evaluated him, but we can;t do it here.

## 2023-02-16 NOTE — ED Triage Notes (Signed)
Pt presents for injection of steroids in the right middle finger.

## 2023-02-16 NOTE — Discharge Instructions (Addendum)
Please follow-up with a primary care provider I have attached you for you today.  Today you have a reassuring exam and you may buddy wrap your finger and use ice or heat whichever feels better.  He may take Tylenol ibuprofen every 6 hours as needed for pain.  I prescribed Flexeril your request.  Please do not operate machinery or drive after taking this medicine as it will make you very drowsy.  If symptoms change or worsen please return to ER.

## 2023-02-16 NOTE — ED Triage Notes (Signed)
Pt reports injury to right middle finger 10 years ago. Excruciating pain at times

## 2023-03-13 ENCOUNTER — Encounter (HOSPITAL_COMMUNITY): Payer: Self-pay

## 2023-03-13 ENCOUNTER — Emergency Department (HOSPITAL_COMMUNITY)
Admission: EM | Admit: 2023-03-13 | Discharge: 2023-03-13 | Disposition: A | Discharge status: Home / Self Care | Payer: Medicaid Other | Attending: Emergency Medicine | Admitting: Emergency Medicine

## 2023-03-13 ENCOUNTER — Emergency Department (HOSPITAL_COMMUNITY): Payer: Medicaid Other

## 2023-03-13 ENCOUNTER — Other Ambulatory Visit: Payer: Self-pay

## 2023-03-13 DIAGNOSIS — S62362A Nondisplaced fracture of neck of third metacarpal bone, right hand, initial encounter for closed fracture: Secondary | ICD-10-CM | POA: Insufficient documentation

## 2023-03-13 DIAGNOSIS — S6991XA Unspecified injury of right wrist, hand and finger(s), initial encounter: Secondary | ICD-10-CM | POA: Diagnosis present

## 2023-03-13 DIAGNOSIS — W2209XA Striking against other stationary object, initial encounter: Secondary | ICD-10-CM | POA: Diagnosis not present

## 2023-03-13 MED ORDER — HYDROCODONE-ACETAMINOPHEN 5-325 MG PO TABS: 1.0000 | ORAL_TABLET | Freq: Four times a day (QID) | ORAL | 0 refills | Status: AC | PRN
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
## 2023-03-13 NOTE — Discharge Instructions (Addendum)
You have been seen today for your complaint of right hand pain. Your imaging revealed a fracture of your knuckle of your middle finger. Your discharge medications include Norco. This is an opioid pain medication. You should only take this medication as needed for severe pain. You should not drive, operate heavy machinery or make important decisions while taking this medication. You should use alternative methods for pain relief while taking this medication including stretching, gentle range of motion, and alternating tylenol and ibuprofen. Follow up with: [facility].  Call to schedule an appointment for a follow-up visit Please seek immediate medical care if you develop any of the following symptoms: You have severe pain. The following happens, even after you loosen your splint: Your hand or fingernails turn blue or gray. Your hand feels cold or numb. At this time there does not appear to be the presence of an emergent medical condition, however there is always the potential for conditions to change. Please read and follow the below instructions.  Do not take your medicine if  develop an itchy rash, swelling in your mouth or lips, or difficulty breathing; call 911 and seek immediate emergency medical attention if this occurs.  You may review your lab tests and imaging results in their entirety on your MyChart account.  Please discuss all results of fully with your primary care provider and other specialist at your follow-up visit.  Note: Portions of this text may have been transcribed using voice recognition software. Every effort was made to ensure accuracy; however, inadvertent computerized transcription errors may still be present.

## 2023-03-13 NOTE — ED Triage Notes (Signed)
Third days ago pt punched a wall and injured right hand. Pt c/o 9/10 pain in hand most severe in middle finger. Hx of right middle finger injury.

## 2023-03-13 NOTE — Progress Notes (Signed)
Orthopedic Tech Progress Note Patient Details:  Lance Ball 02-04-1986 [id number]  Ortho Devices Type of Ortho Device: Rad Gutter splint Ortho Device/Splint Location: right Ortho Device/Splint Interventions: Ordered, Application, Adjustment   Post Interventions Patient Tolerated: Well Instructions Provided: Adjustment of device, Care of device  Kizzie Fantasia 03/13/2023, 2:59 PM

## 2023-06-27 ENCOUNTER — Encounter (HOSPITAL_BASED_OUTPATIENT_CLINIC_OR_DEPARTMENT_OTHER): Payer: Self-pay

## 2023-06-27 ENCOUNTER — Other Ambulatory Visit: Payer: Self-pay

## 2023-06-27 ENCOUNTER — Emergency Department (HOSPITAL_BASED_OUTPATIENT_CLINIC_OR_DEPARTMENT_OTHER)
Admission: EM | Admit: 2023-06-27 | Discharge: 2023-06-27 | Disposition: A | Discharge status: Home / Self Care | Payer: Medicaid Other | Attending: Emergency Medicine | Admitting: Emergency Medicine

## 2023-06-27 DIAGNOSIS — M25512 Pain in left shoulder: Secondary | ICD-10-CM | POA: Diagnosis present

## 2023-06-27 DIAGNOSIS — J45909 Unspecified asthma, uncomplicated: Secondary | ICD-10-CM | POA: Diagnosis not present

## 2023-06-27 MED ORDER — KETOROLAC TROMETHAMINE 30 MG/ML IJ SOLN
30.0000 mg | Freq: Once | INTRAMUSCULAR | Status: AC
  Administered 2023-06-27: 30 mg via INTRAMUSCULAR
  Filled 2023-06-27: qty 1

## 2023-06-27 MED ORDER — DICLOFENAC SODIUM 1 % EX GEL: 2.0000 g | Freq: Four times a day (QID) | CUTANEOUS | 0 refills | Status: AC | PRN

## 2023-06-27 MED ORDER — MELOXICAM 15 MG PO TABS
15.0000 mg | ORAL_TABLET | Freq: Every day | ORAL | 0 refills | Status: AC
Start: 2023-06-27 — End: 2023-07-04
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
## 2023-06-27 NOTE — Discharge Instructions (Signed)
